# Patient Record
Sex: Female | Born: 1965 | Race: White | Hispanic: No | Marital: Married | Smoking: Former smoker
Health system: Southern US, Community
[De-identification: ages and names within clinical notes are randomized; demographics above are authoritative.]

## PROBLEM LIST (undated history)

## (undated) DIAGNOSIS — G43909 Migraine, unspecified, not intractable, without status migrainosus: Secondary | ICD-10-CM

## (undated) DIAGNOSIS — R002 Palpitations: Secondary | ICD-10-CM

## (undated) DIAGNOSIS — R58 Hemorrhage, not elsewhere classified: Secondary | ICD-10-CM

## (undated) DIAGNOSIS — I4719 Other supraventricular tachycardia: Secondary | ICD-10-CM

## (undated) DIAGNOSIS — I480 Paroxysmal atrial fibrillation: Secondary | ICD-10-CM

## (undated) DIAGNOSIS — R87619 Unspecified abnormal cytological findings in specimens from cervix uteri: Secondary | ICD-10-CM

## (undated) DIAGNOSIS — I471 Supraventricular tachycardia: Secondary | ICD-10-CM

## (undated) DIAGNOSIS — C4491 Basal cell carcinoma of skin, unspecified: Secondary | ICD-10-CM

## (undated) DIAGNOSIS — A6920 Lyme disease, unspecified: Secondary | ICD-10-CM

## (undated) HISTORY — DX: Basal cell carcinoma of skin, unspecified: C44.91

## (undated) HISTORY — DX: Lyme disease, unspecified: A69.20

## (undated) HISTORY — DX: Supraventricular tachycardia: I47.1

## (undated) HISTORY — DX: Unspecified abnormal cytological findings in specimens from cervix uteri: R87.619

## (undated) HISTORY — DX: Migraine, unspecified, not intractable, without status migrainosus: G43.909

## (undated) HISTORY — DX: Other supraventricular tachycardia: I47.19

---
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
## 1996-10-04 DIAGNOSIS — R87619 Unspecified abnormal cytological findings in specimens from cervix uteri: Secondary | ICD-10-CM

## 1996-10-04 HISTORY — DX: Unspecified abnormal cytological findings in specimens from cervix uteri: R87.619

## 2000-10-04 HISTORY — PX: COLONOSCOPY: SHX174

## 2005-01-11 ENCOUNTER — Inpatient Hospital Stay: Payer: Self-pay | Admitting: Unknown Physician Specialty

## 2005-01-11 DIAGNOSIS — O321XX0 Maternal care for breech presentation, not applicable or unspecified: Secondary | ICD-10-CM

## 2005-07-27 ENCOUNTER — Emergency Department: Payer: Self-pay | Admitting: Unknown Physician Specialty

## 2005-07-27 ENCOUNTER — Other Ambulatory Visit: Payer: Self-pay

## 2005-07-28 ENCOUNTER — Ambulatory Visit: Payer: Self-pay | Admitting: Unknown Physician Specialty

## 2005-08-13 ENCOUNTER — Ambulatory Visit: Payer: Self-pay | Admitting: Internal Medicine

## 2005-09-02 ENCOUNTER — Ambulatory Visit: Payer: Self-pay | Admitting: Unknown Physician Specialty

## 2005-09-07 ENCOUNTER — Ambulatory Visit: Payer: Self-pay | Admitting: Unknown Physician Specialty

## 2005-12-09 ENCOUNTER — Emergency Department: Payer: Self-pay | Admitting: Emergency Medicine

## 2006-07-01 ENCOUNTER — Ambulatory Visit: Payer: Self-pay

## 2006-10-04 HISTORY — PX: CARPAL TUNNEL RELEASE: SHX101

## 2007-01-17 ENCOUNTER — Ambulatory Visit: Payer: Self-pay

## 2007-09-19 ENCOUNTER — Ambulatory Visit: Payer: Self-pay

## 2007-09-21 ENCOUNTER — Ambulatory Visit: Payer: Self-pay

## 2007-09-22 ENCOUNTER — Ambulatory Visit: Payer: Self-pay

## 2008-08-06 ENCOUNTER — Ambulatory Visit: Payer: Self-pay | Admitting: Nurse Practitioner

## 2008-09-03 ENCOUNTER — Ambulatory Visit: Payer: Self-pay | Admitting: Nurse Practitioner

## 2008-10-04 HISTORY — PX: LASER ABLATION: SHX1947

## 2008-10-29 ENCOUNTER — Ambulatory Visit: Payer: Self-pay

## 2010-01-08 ENCOUNTER — Ambulatory Visit: Payer: Self-pay

## 2010-10-04 DIAGNOSIS — C4491 Basal cell carcinoma of skin, unspecified: Secondary | ICD-10-CM

## 2010-10-04 HISTORY — DX: Basal cell carcinoma of skin, unspecified: C44.91

## 2011-01-13 ENCOUNTER — Ambulatory Visit: Payer: Self-pay

## 2011-07-13 ENCOUNTER — Ambulatory Visit: Payer: Self-pay | Admitting: Specialist

## 2011-07-28 ENCOUNTER — Ambulatory Visit: Payer: Self-pay | Admitting: Specialist

## 2011-07-28 HISTORY — PX: HAND SURGERY: SHX662

## 2011-09-01 ENCOUNTER — Ambulatory Visit: Payer: Self-pay | Admitting: Specialist

## 2011-09-08 ENCOUNTER — Ambulatory Visit: Payer: Self-pay | Admitting: Specialist

## 2011-09-08 HISTORY — PX: CARPAL TUNNEL RELEASE: SHX101

## 2011-09-09 LAB — PATHOLOGY REPORT

## 2012-02-01 ENCOUNTER — Ambulatory Visit: Payer: Self-pay

## 2012-02-02 ENCOUNTER — Ambulatory Visit: Payer: Self-pay

## 2012-08-04 HISTORY — PX: BASAL CELL CARCINOMA EXCISION: SHX1214

## 2013-02-02 ENCOUNTER — Ambulatory Visit: Payer: Self-pay

## 2013-02-06 ENCOUNTER — Ambulatory Visit: Payer: Self-pay

## 2013-04-03 ENCOUNTER — Emergency Department: Payer: Self-pay | Admitting: Emergency Medicine

## 2014-02-15 ENCOUNTER — Ambulatory Visit: Payer: Self-pay

## 2015-02-20 ENCOUNTER — Other Ambulatory Visit: Payer: Self-pay | Admitting: Certified Nurse Midwife

## 2015-02-20 ENCOUNTER — Ambulatory Visit
Admission: RE | Admit: 2015-02-20 | Discharge: 2015-02-20 | Disposition: A | Discharge status: Home / Self Care | Payer: BLUE CROSS/BLUE SHIELD | Source: Ambulatory Visit | Attending: Certified Nurse Midwife | Admitting: Certified Nurse Midwife

## 2015-02-20 DIAGNOSIS — Z1231 Encounter for screening mammogram for malignant neoplasm of breast: Secondary | ICD-10-CM | POA: Diagnosis present

## 2016-02-11 ENCOUNTER — Other Ambulatory Visit: Payer: Self-pay | Admitting: Certified Nurse Midwife

## 2016-02-11 DIAGNOSIS — Z1231 Encounter for screening mammogram for malignant neoplasm of breast: Secondary | ICD-10-CM

## 2016-02-25 ENCOUNTER — Ambulatory Visit: Payer: BLUE CROSS/BLUE SHIELD

## 2016-03-04 ENCOUNTER — Encounter (INDEPENDENT_AMBULATORY_CARE_PROVIDER_SITE_OTHER): Payer: Self-pay

## 2016-03-04 ENCOUNTER — Other Ambulatory Visit: Payer: Self-pay | Admitting: Certified Nurse Midwife

## 2016-03-04 ENCOUNTER — Ambulatory Visit
Admission: RE | Admit: 2016-03-04 | Discharge: 2016-03-04 | Disposition: A | Discharge status: Home / Self Care | Payer: BLUE CROSS/BLUE SHIELD | Source: Ambulatory Visit | Attending: Certified Nurse Midwife | Admitting: Certified Nurse Midwife

## 2016-03-04 DIAGNOSIS — Z1231 Encounter for screening mammogram for malignant neoplasm of breast: Secondary | ICD-10-CM

## 2017-02-04 ENCOUNTER — Other Ambulatory Visit: Payer: Self-pay | Admitting: Certified Nurse Midwife

## 2017-02-04 ENCOUNTER — Other Ambulatory Visit: Payer: Self-pay | Admitting: Family Medicine

## 2017-02-04 DIAGNOSIS — Z1231 Encounter for screening mammogram for malignant neoplasm of breast: Secondary | ICD-10-CM

## 2017-03-09 ENCOUNTER — Ambulatory Visit
Admission: RE | Admit: 2017-03-09 | Discharge: 2017-03-09 | Disposition: A | Discharge status: Home / Self Care | Payer: BC Managed Care – PPO | Source: Ambulatory Visit | Attending: Family Medicine | Admitting: Family Medicine

## 2017-03-09 ENCOUNTER — Ambulatory Visit (INDEPENDENT_AMBULATORY_CARE_PROVIDER_SITE_OTHER): Payer: BC Managed Care – PPO | Admitting: Advanced Practice Midwife

## 2017-03-09 ENCOUNTER — Encounter: Payer: Self-pay | Admitting: Advanced Practice Midwife

## 2017-03-09 VITALS — BP 124/70 | Ht 66.0 in | Wt 142.0 lb

## 2017-03-09 DIAGNOSIS — N6311 Unspecified lump in the right breast, upper outer quadrant: Secondary | ICD-10-CM | POA: Diagnosis not present

## 2017-03-09 DIAGNOSIS — Z1231 Encounter for screening mammogram for malignant neoplasm of breast: Secondary | ICD-10-CM

## 2017-03-09 DIAGNOSIS — Z124 Encounter for screening for malignant neoplasm of cervix: Secondary | ICD-10-CM | POA: Diagnosis not present

## 2017-03-09 DIAGNOSIS — Z01419 Encounter for gynecological examination (general) (routine) without abnormal findings: Secondary | ICD-10-CM | POA: Diagnosis not present

## 2017-03-09 NOTE — Progress Notes (Signed)
Patient ID: Donna Reid, female   DOB: 1966/02/29, 51 y.o.   MRN: [id number]      Gynecology Annual Exam  PCP: System, Pcp Not In  Chief Complaint:  Chief Complaint  Patient presents with  . Annual Exam    History of Present Illness: Patient is a 51 y.o. [name] presents for annual exam. The patient has complaints today of lump in right breast. The patient was going to have a screening mammogram and then informed the breast center of a lump she noticed. She was then redirected to [facility] for annual clinic exam and then will have diagnostic imaging done. She first noticed the lump about a month ago. It is mobile and non-tender.    LMP: Patient's last menstrual period was 02/28/2017. Average Interval: regular, 28 days Duration of flow: 5 days Heavy Menses: no Clots: no Intermenstrual Bleeding: no Postcoital Bleeding: no Dysmenorrhea: no   The patient is sexually active. She currently uses vasectomy for contraception. She denies dyspareunia.  The patient does perform self breast exams.  There is no notable family history of breast or ovarian cancer in her family.  The patient wears seatbelts: yes.   The patient has regular exercise: yes.  The patient admits to occasional tobacco use.  The patient denies current symptoms of depression.    Review of Systems: Review of Systems  Constitutional: Negative.   HENT: Negative.   Eyes: Negative.   Respiratory: Negative.   Cardiovascular: Negative.   Gastrointestinal: Negative.   Genitourinary: Negative.   Musculoskeletal: Negative.   Skin: Negative.   Neurological: Negative.   Endo/Heme/Allergies: Negative.   Psychiatric/Behavioral: Negative.     Past Medical History:  History reviewed. No pertinent past medical history.  Past Surgical History:  Past Surgical History:  Procedure Laterality Date  . BASAL CELL CARCINOMA EXCISION    . CESAREAN SECTION      Gynecologic History:  Patient's last menstrual period was  02/28/2017. Contraception: vasectomy Last Pap: 1 year ago, results were: normal   Last mammogram: 1 year ago, results were: normal Obstetric History: [id number]  Family History:  History reviewed. No pertinent family history.  Social History:  Social History   Social History  . Marital status: Married    Spouse name: N/A  . Number of children: N/A  . Years of education: N/A   Occupational History  . Not on file.   Social History Main Topics  . Smoking status: Never Smoker  . Smokeless tobacco: Never Used  . Alcohol use No  . Drug use: No  . Sexual activity: Yes    Birth control/ protection: Surgical   Other Topics Concern  . Not on file   Social History Narrative  . No narrative on file    Allergies:  Allergies  Allergen Reactions  . Latex     Medications: Prior to Admission medications   Medication Sig Start Date End Date Taking? Authorizing Provider  valACYclovir (VALTREX) 1000 MG tablet  10/24/14  Yes [provider]    Physical Exam Vitals: Blood pressure 124/70, height 5\' 6"  (1.676 m), weight 142 lb (64.4 kg), last menstrual period 02/28/2017.  General: NAD HEENT: normocephalic, anicteric Thyroid: no enlargement, no palpable nodules Pulmonary: No increased work of breathing, CTAB Cardiovascular: RRR, distal pulses 2+ Breast: Breast symmetrical, no tenderness, no skin or nipple retraction present, no nipple discharge.  No axillary or supraclavicular lymphadenopathy. Right breast with mobile non-tender lump that is approximately 3cm x 4cm at 10:00 o'clock  Abdomen:  NABS, soft, non-tender, non-distended.  Umbilicus without lesions.  No hepatomegaly, splenomegaly or masses palpable. No evidence of hernia  Genitourinary:  External: Normal external female genitalia.  Normal urethral meatus, normal  Bartholin's and Skene's glands.    Vagina: Normal vaginal mucosa, no evidence of prolapse.    Cervix: Grossly normal in appearance, no bleeding, no  CMT  Uterus: Non-enlarged, mobile, normal contour.    Adnexa: ovaries non-enlarged, no adnexal masses  Rectal: deferred  Lymphatic: no evidence of inguinal lymphadenopathy Extremities: no edema, erythema, or tenderness Neurologic: Grossly intact Psychiatric: mood appropriate, affect full   Assessment: 51 y.o. [id number] Well woman exam with PAP smear  Plan: Problem List Items Addressed This Visit    None    Visit Diagnoses    Well woman exam with routine gynecological exam    -  Primary   Relevant Orders   Pap IG (Image Guided)   Cervical cancer screening       Relevant Orders   Pap IG (Image Guided)      1) Mammogram - recommend yearly screening mammogram.  Mammogram diagnostic mammogram and ultrasound orders sent. Message sent to [facility] for assistance with referral to breast clinic consult with Dr Bary Castilla per Dr Kenton Kingfisher recommendation   2) Continue healthy lifestyle diet and exercise  3) ASCCP guidelines and rational discussed.  Patient opts for yearly screening interval  4) Contraception - vasectomy  5) Routine healthcare maintenance including cholesterol, diabetes screening discussed managed by PCP   Rod Can, CNM

## 2017-03-11 ENCOUNTER — Other Ambulatory Visit: Payer: Self-pay | Admitting: Advanced Practice Midwife

## 2017-03-11 DIAGNOSIS — N6311 Unspecified lump in the right breast, upper outer quadrant: Secondary | ICD-10-CM

## 2017-03-11 LAB — PAP IG (IMAGE GUIDED): PAP Smear Comment: 0

## 2017-03-14 ENCOUNTER — Telehealth: Payer: Self-pay | Admitting: Advanced Practice Midwife

## 2017-03-14 ENCOUNTER — Other Ambulatory Visit: Payer: Self-pay | Admitting: Advanced Practice Midwife

## 2017-03-14 ENCOUNTER — Ambulatory Visit
Admission: RE | Admit: 2017-03-14 | Discharge: 2017-03-14 | Disposition: A | Discharge status: Home / Self Care | Payer: BC Managed Care – PPO | Source: Ambulatory Visit | Attending: Advanced Practice Midwife | Admitting: Advanced Practice Midwife

## 2017-03-14 ENCOUNTER — Encounter: Payer: Self-pay | Admitting: General Surgery

## 2017-03-14 DIAGNOSIS — N6311 Unspecified lump in the right breast, upper outer quadrant: Secondary | ICD-10-CM

## 2017-03-14 NOTE — Telephone Encounter (Signed)
Patient is aware but will be out of town 03/18/17 - 04/02/17. Per Brittney @ [facility], they do not have anything sooner than 03/18/17. Appt cancelled. Per [facility] @ [facility], patient is scheduled for today @ 2pm. Per Wells Guiles @ [facility], Dr Bary Castilla is booked this week so the patient is rescheduled for 04/05/17 @ 9am. Will see sooner if category 4 or 5.

## 2017-03-14 NOTE — Telephone Encounter (Signed)
Pt is calling about missed call. Pt would like a call [phone]

## 2017-03-14 NOTE — Telephone Encounter (Signed)
Patient is scheduled at [facility]'s first available appointment on Friday, 03/18/17 @ 9:20am. Patient is also scheduled to see Dr Bary Castilla at [facility] on Thursday, 03/24/17 @ 2:15pm. Threasa Beards will mail paperwork. Patient is to arrive @ 2pm if paperwork is completed, 1:45pm if not, and bring ins/id/specialty copay/meds in original bottles. [facility] is located in the 2-story building next door to our office, on the first floor. [facility]'s phone# is [phone]. Lmtrc.

## 2017-03-14 NOTE — Telephone Encounter (Signed)
Patient is aware of [facility] today, arrive at 2pm, 2 piece clothing, no lotion, powder, perfume, or deoderant, id and ins card. Patient was given address [address], 4th floor, [address]. Patient aware corner of [address], red brick building, parking in deck, across from [place], phone# [phone] or [phone]. Patient is aware of appt w/ Dr Bary Castilla on Tuesday, 04/05/17 @ 9am, arrive at 8:45am if paperwork is completed, 8:30am if not, bring ins/id/specialty copay/meds in original bottles. Dr Bary Castilla to see patient sooner if needed depending on results. Patient aware 2-story building next door to our office, first office on the right, phone# [phone].

## 2017-03-18 ENCOUNTER — Other Ambulatory Visit: Payer: BLUE CROSS/BLUE SHIELD

## 2017-03-24 ENCOUNTER — Ambulatory Visit: Payer: BC Managed Care – PPO | Admitting: General Surgery

## 2017-04-05 ENCOUNTER — Ambulatory Visit: Payer: BC Managed Care – PPO | Admitting: General Surgery

## 2017-04-21 ENCOUNTER — Encounter: Payer: Self-pay | Admitting: *Deleted

## 2017-10-04 DIAGNOSIS — I480 Paroxysmal atrial fibrillation: Secondary | ICD-10-CM

## 2017-10-04 HISTORY — DX: Paroxysmal atrial fibrillation: I48.0

## 2017-10-17 ENCOUNTER — Encounter (INDEPENDENT_AMBULATORY_CARE_PROVIDER_SITE_OTHER): Payer: BC Managed Care – PPO | Admitting: Vascular Surgery

## 2018-02-15 ENCOUNTER — Observation Stay
Admission: EM | Admit: 2018-02-15 | Discharge: 2018-02-16 | Disposition: A | Discharge status: Home / Self Care | Payer: BC Managed Care – PPO | Attending: Internal Medicine | Admitting: Internal Medicine

## 2018-02-15 ENCOUNTER — Emergency Department: Payer: BC Managed Care – PPO

## 2018-02-15 ENCOUNTER — Other Ambulatory Visit: Payer: Self-pay

## 2018-02-15 DIAGNOSIS — R42 Dizziness and giddiness: Secondary | ICD-10-CM | POA: Insufficient documentation

## 2018-02-15 DIAGNOSIS — R002 Palpitations: Secondary | ICD-10-CM | POA: Diagnosis present

## 2018-02-15 DIAGNOSIS — R748 Abnormal levels of other serum enzymes: Secondary | ICD-10-CM | POA: Diagnosis present

## 2018-02-15 DIAGNOSIS — R0789 Other chest pain: Secondary | ICD-10-CM | POA: Diagnosis not present

## 2018-02-15 DIAGNOSIS — R778 Other specified abnormalities of plasma proteins: Secondary | ICD-10-CM

## 2018-02-15 DIAGNOSIS — R7989 Other specified abnormal findings of blood chemistry: Secondary | ICD-10-CM | POA: Insufficient documentation

## 2018-02-15 DIAGNOSIS — Z85828 Personal history of other malignant neoplasm of skin: Secondary | ICD-10-CM | POA: Insufficient documentation

## 2018-02-15 DIAGNOSIS — Z87891 Personal history of nicotine dependence: Secondary | ICD-10-CM | POA: Insufficient documentation

## 2018-02-15 DIAGNOSIS — Z79899 Other long term (current) drug therapy: Secondary | ICD-10-CM | POA: Diagnosis not present

## 2018-02-15 DIAGNOSIS — R079 Chest pain, unspecified: Secondary | ICD-10-CM | POA: Diagnosis present

## 2018-02-15 HISTORY — DX: Palpitations: R00.2

## 2018-02-15 LAB — FIBRIN DERIVATIVES D-DIMER (ARMC ONLY): Fibrin derivatives D-dimer (ARMC): 132.77 ng/mL (FEU) (ref 0.00–499.00)

## 2018-02-15 LAB — CBC
HEMATOCRIT: 43.3 % (ref 35.0–47.0)
HEMOGLOBIN: 15 g/dL (ref 12.0–16.0)
MCH: 31.7 pg (ref 26.0–34.0)
MCHC: 34.6 g/dL (ref 32.0–36.0)
MCV: 91.7 fL (ref 80.0–100.0)
Platelets: 285 10*3/uL (ref 150–440)
RBC: 4.72 MIL/uL (ref 3.80–5.20)
RDW: 13 % (ref 11.5–14.5)
WBC: 5.4 10*3/uL (ref 3.6–11.0)

## 2018-02-15 LAB — TROPONIN I
TROPONIN I: 0.07 ng/mL — AB (ref <0.03)
Troponin I: <0.03 ng/mL (ref <0.03)

## 2018-02-15 LAB — BASIC METABOLIC PANEL
ANION GAP: 8 (ref 5–15)
BUN: 11 mg/dL (ref 6–20)
CHLORIDE: 105 mmol/L (ref 101–111)
CO2: 26 mmol/L (ref 22–32)
Calcium: 8.9 mg/dL (ref 8.9–10.3)
Creatinine, Ser: 0.78 mg/dL (ref 0.44–1.00)
GFR calc Af Amer: >60 mL/min (ref >60)
GLUCOSE: 108 mg/dL — AB (ref 65–99)
POTASSIUM: 3.6 mmol/L (ref 3.5–5.1)
Sodium: 139 mmol/L (ref 135–145)

## 2018-02-15 MED ORDER — ASPIRIN 81 MG PO CHEW
324.0000 mg | CHEWABLE_TABLET | Freq: Once | ORAL | Status: AC
  Administered 2018-02-15: 324 mg via ORAL
  Filled 2018-02-15: qty 4

## 2018-02-15 MED ORDER — NITROGLYCERIN 2 % TD OINT
0.5000 [in_us] | TOPICAL_OINTMENT | Freq: Once | TRANSDERMAL | Status: AC
  Administered 2018-02-15: 0.5 [in_us] via TOPICAL
  Filled 2018-02-15: qty 1

## 2018-02-15 MED ORDER — SODIUM CHLORIDE 0.9 % IV BOLUS
1000.0000 mL | Freq: Once | INTRAVENOUS | Status: AC
  Administered 2018-02-15: 1000 mL via INTRAVENOUS

## 2018-02-15 NOTE — H&P (Signed)
[facility] at [place] [facility] NAME: Donna Reid    MR#:  [id number]  DATE OF BIRTH:  Feb 07, 1966  DATE OF ADMISSION:  02/15/2018  PRIMARY CARE PHYSICIAN: System, Pcp Not In   REQUESTING/REFERRING PHYSICIAN: Quentin Cornwall, MD  CHIEF COMPLAINT:   Chief Complaint  Patient presents with  . Chest Pain    HISTORY OF PRESENT ILLNESS:  Donna Reid  is a 52 y.o. female who presents with an episode of palpitations, followed by some chest pain lasting a couple hours.  Patient states that palpitations started first and continued consistently for the duration of her symptoms.  After a couple of hours she started to develop some chest tightness.  She came to the ED and her first troponin was negative, but her second troponin was positive at 0.07.  Hospitalist were called for admission  PAST MEDICAL HISTORY:   Past Medical History:  Diagnosis Date  . Palpitations      PAST SURGICAL HISTORY:   Past Surgical History:  Procedure Laterality Date  . BASAL CELL CARCINOMA EXCISION    . CESAREAN SECTION       SOCIAL HISTORY:   Social History   Tobacco Use  . Smoking status: Former Research scientist (life sciences)  . Smokeless tobacco: Never Used  Substance Use Topics  . Alcohol use: No     FAMILY HISTORY:   Family History  Problem Relation Age of Onset  . Diabetes Maternal Grandmother   . Heart attack Maternal Grandfather   . Diabetes Paternal Grandmother      DRUG ALLERGIES:   Allergies  Allergen Reactions  . Latex     MEDICATIONS AT HOME:   Prior to Admission medications   Medication Sig Start Date End Date Taking? Authorizing Provider  fexofenadine-pseudoephedrine (ALLEGRA-D 24) 180-240 MG 24 hr tablet Take 1 tablet by mouth daily.   Yes [provider]  valACYclovir (VALTREX) 1000 MG tablet  10/24/14   [provider]    REVIEW OF SYSTEMS:  Review of Systems  Constitutional: Negative for chills, fever, malaise/fatigue and weight  loss.  HENT: Negative for ear pain, hearing loss and tinnitus.   Eyes: Negative for blurred vision, double vision, pain and redness.  Respiratory: Negative for cough, hemoptysis and shortness of breath.   Cardiovascular: Positive for chest pain and palpitations. Negative for orthopnea and leg swelling.  Gastrointestinal: Negative for abdominal pain, constipation, diarrhea, nausea and vomiting.  Genitourinary: Negative for dysuria, frequency and hematuria.  Musculoskeletal: Negative for back pain, joint pain and neck pain.  Skin:       No acne, rash, or lesions  Neurological: Negative for dizziness, tremors, focal weakness and weakness.  Endo/Heme/Allergies: Negative for polydipsia. Does not bruise/bleed easily.  Psychiatric/Behavioral: Negative for depression. The patient is not nervous/anxious and does not have insomnia.      VITAL SIGNS:   Vitals:   02/15/18 2157 02/15/18 2200 02/15/18 2230 02/15/18 2330  BP:  132/78 121/79 122/83  Pulse:  69 66 64  Resp: 16     Temp:      TempSrc:      SpO2:  100% 100% 100%  Weight:      Height:       Wt Readings from Last 3 Encounters:  02/15/18 61.2 kg (135 lb)  03/09/17 64.4 kg (142 lb)    PHYSICAL EXAMINATION:  Physical Exam  Vitals reviewed. Constitutional: She is oriented to person, place, and time. She appears well-developed and well-nourished. No distress.  HENT:  Head: Normocephalic and atraumatic.  Mouth/Throat: Oropharynx is clear and moist.  Eyes: Pupils are equal, round, and reactive to light. Conjunctivae and EOM are normal. No scleral icterus.  Neck: Normal range of motion. Neck supple. No JVD present. No thyromegaly present.  Cardiovascular: Normal rate, regular rhythm and intact distal pulses. Exam reveals no gallop and no friction rub.  No murmur heard. Respiratory: Effort normal and breath sounds normal. No respiratory distress. She has no wheezes. She has no rales.  GI: Soft. Bowel sounds are normal. She exhibits no  distension. There is no tenderness.  Musculoskeletal: Normal range of motion. She exhibits no edema.  No arthritis, no gout  Lymphadenopathy:    She has no cervical adenopathy.  Neurological: She is alert and oriented to person, place, and time. No cranial nerve deficit.  No dysarthria, no aphasia  Skin: Skin is warm and dry. No rash noted. No erythema.  Psychiatric: She has a normal mood and affect. Her behavior is normal. Judgment and thought content normal.    LABORATORY PANEL:   CBC Recent Labs  Lab 02/15/18 1837  WBC 5.4  HGB 15.0  HCT 43.3  PLT 285   ------------------------------------------------------------------------------------------------------------------  Chemistries  Recent Labs  Lab 02/15/18 1837  NA 139  K 3.6  CL 105  CO2 26  GLUCOSE 108*  BUN 11  CREATININE 0.78  CALCIUM 8.9   ------------------------------------------------------------------------------------------------------------------  Cardiac Enzymes Recent Labs  Lab 02/15/18 2156  TROPONINI 0.07*   ------------------------------------------------------------------------------------------------------------------  RADIOLOGY:  Dg Chest 2 View  Result Date: 02/15/2018 CLINICAL DATA:  Chest pain EXAM: CHEST - 2 VIEW COMPARISON:  07/27/2005 FINDINGS: Hyperinflation. No focal opacity or pleural effusion. Pectus deformity of the lower anterior chest wall contributing to increased opacity at the right cardiac border. No pneumothorax. Mild scoliosis of the spine IMPRESSION: No active cardiopulmonary disease. Electronically Signed   By: Donavan Foil M.D.   On: 02/15/2018 19:34    EKG:   Orders placed or performed during the hospital encounter of 02/15/18  . EKG 12-Lead  . EKG 12-Lead  . ED EKG within 10 minutes  . ED EKG within 10 minutes    IMPRESSION AND PLAN:  Principal Problem:   Chest pain -second troponin positive, we will admit her and trend her troponins, get an echocardiogram  and a cardiology consult.  Chart review performed and case discussed with ED provider. Labs, imaging and/or ECG reviewed by provider and discussed with patient/family. Management plans discussed with the patient and/or family.  DVT PROPHYLAXIS: SubQ lovenox  GI PROPHYLAXIS: None  ADMISSION STATUS: Observation  CODE STATUS: Full  TOTAL TIME TAKING CARE OF THIS PATIENT: 40 minutes.   Dutch Ing [place] 02/15/2018, 11:45 PM  [employer]  [phone]  CC: Primary care physician; System, Pcp Not In  Note:  This document was prepared using Dragon voice recognition software and may include unintentional dictation errors.

## 2018-02-15 NOTE — ED Triage Notes (Signed)
Pt to ER via POV c/o chest pain to left side that started at 1600t doay while driving. C/o SOB that began at same times. Denies NV, diaphoresis. Pt alert and oriented X4, active, cooperative, pt in NAD. RR even and unlabored, color WNL.

## 2018-02-15 NOTE — ED Provider Notes (Signed)
[facility] Emergency Department Provider Note    First MD Initiated Contact with Patient 02/15/18 2135     (approximate)  I have reviewed the triage vital signs and the nursing notes.   HISTORY  Chief Complaint Chest Pain    HPI Donna Reid is a 52 y.o. female presents to the ER with chief complaint of left-sided chest pain and pressure that started around 4:00 today while she was driving.  Says that she started feeling flushed and having palpitations.  Her daughter was there with her who is a EMT student states that she measured her heart rate manual up to 160.  Patient started feeling groggy.  Did not lose consciousness.  No pain ripping or tearing through to her back.  Denies any nausea or vomiting or diaphoresis.  Has never had pain like this before but has had issues with palpitations in the past and did have a Holter monitor which was reportedly negative.  She does not smoke.  No history of high blood pressure, high cholesterol or diabetes.  No recent fevers.  She is currently pain-free.  History reviewed. No pertinent past medical history. No family history on file. Past Surgical History:  Procedure Laterality Date  . BASAL CELL CARCINOMA EXCISION    . CESAREAN SECTION     There are no active problems to display for this patient.     Prior to Admission medications   Medication Sig Start Date End Date Taking? Authorizing Provider  fexofenadine-pseudoephedrine (ALLEGRA-D 24) 180-240 MG 24 hr tablet Take 1 tablet by mouth daily.   Yes [provider]  valACYclovir (VALTREX) 1000 MG tablet  10/24/14   [provider]    Allergies Latex    Social History Social History   Tobacco Use  . Smoking status: Never Smoker  . Smokeless tobacco: Never Used  Substance Use Topics  . Alcohol use: No  . Drug use: No    Review of Systems Patient denies headaches, rhinorrhea, blurry vision, numbness, shortness of breath, chest  pain, edema, cough, abdominal pain, nausea, vomiting, diarrhea, dysuria, fevers, rashes or hallucinations unless otherwise stated above in HPI. ____________________________________________   PHYSICAL EXAM:  VITAL SIGNS: Vitals:   02/15/18 2200 02/15/18 2230  BP: 132/78 121/79  Pulse: 69 66  Resp:    Temp:    SpO2: 100% 100%    Constitutional: Alert and oriented. Well appearing and in no acute distress. Eyes: Conjunctivae are normal.  Head: Atraumatic. Nose: No congestion/rhinnorhea. Mouth/Throat: Mucous membranes are moist.   Neck: No stridor. Painless ROM.  Cardiovascular: Normal rate, regular rhythm. Grossly normal heart sounds.  Good peripheral circulation. Respiratory: Normal respiratory effort.  No retractions. Lungs CTAB. Gastrointestinal: Soft and nontender. No distention. No abdominal bruits. No CVA tenderness. Genitourinary:  Musculoskeletal: No lower extremity tenderness nor edema.  No joint effusions. Neurologic:  Normal speech and language. No gross focal neurologic deficits are appreciated. No facial droop Skin:  Skin is warm, dry and intact. No rash noted. Psychiatric: Mood and affect are normal. Speech and behavior are normal.  ____________________________________________   LABS (all labs ordered are listed, but only abnormal results are displayed)  Results for orders placed or performed during the hospital encounter of 02/15/18 (from the past 24 hour(s))  Basic metabolic panel     Status: Abnormal   Collection Time: 02/15/18  6:37 PM  Result Value Ref Range   Sodium 139 135 - 145 mmol/L   Potassium 3.6 3.5 - 5.1 mmol/L  Chloride 105 101 - 111 mmol/L   CO2 26 22 - 32 mmol/L   Glucose, Bld 108 (H) 65 - 99 mg/dL   BUN 11 6 - 20 mg/dL   Creatinine, Ser 0.78 0.44 - 1.00 mg/dL   Calcium 8.9 8.9 - 10.3 mg/dL   GFR calc non Af Amer >60 >60 mL/min   GFR calc Af Amer >60 >60 mL/min   Anion gap 8 5 - 15  CBC     Status: None   Collection Time: 02/15/18  6:37  PM  Result Value Ref Range   WBC 5.4 3.6 - 11.0 K/uL   RBC 4.72 3.80 - 5.20 MIL/uL   Hemoglobin 15.0 12.0 - 16.0 g/dL   HCT 43.3 35.0 - 47.0 %   MCV 91.7 80.0 - 100.0 fL   MCH 31.7 26.0 - 34.0 pg   MCHC 34.6 32.0 - 36.0 g/dL   RDW 13.0 11.5 - 14.5 %   Platelets 285 150 - 440 K/uL  Troponin I     Status: None   Collection Time: 02/15/18  6:37 PM  Result Value Ref Range   Troponin I <0.03 <0.03 ng/mL  Fibrin derivatives D-Dimer ([facility] only)     Status: None   Collection Time: 02/15/18  9:48 PM  Result Value Ref Range   Fibrin derivatives D-dimer ([facility]) 132.77 0.00 - 499.00 ng/mL (FEU)  Troponin I     Status: Abnormal   Collection Time: 02/15/18  9:56 PM  Result Value Ref Range   Troponin I 0.07 (HH) <0.03 ng/mL   ____________________________________________  EKG My review and personal interpretation at Time: 18:27   Indication: chest pain  Rate: 120  Rhythm: sinus Axis: normal Other: normal intervals, poor r wave progression? ____________________________________________  RADIOLOGY  I personally reviewed all radiographic images ordered to evaluate for the above acute complaints and reviewed radiology reports and findings.  These findings were personally discussed with the patient.  Please see medical record for radiology report.  ____________________________________________   PROCEDURES  Procedure(s) performed:  Procedures    Critical Care performed: no ____________________________________________   INITIAL IMPRESSION / ASSESSMENT AND PLAN / ED COURSE  Pertinent labs & imaging results that were available during my care of the patient were reviewed by me and considered in my medical decision making (see chart for details).  DDX: ACS, pericarditis, esophagitis, boerhaaves, pe, dissection, pna, bronchitis, costochondritis   Donna Reid is a 52 y.o. who presents to the ED with chest pain as described above.  EKG shows no evidence of acute ischemia certainly no  STEMI but does have some nonspecific changes with delayed R wave progression.  May even be reflective of a slow to do one a flutter as there is striking regularity and perhaps the patient was having episodes of RVR causing pain.  Initial troponin is negative.  Her abdominal exam is soft and benign.  She is low risk by Wells criteria but given her tachycardia a d-dimer was sent to further risk stratify which is negative.  Given the patient's mentation a repeat troponin was sent to further risk stratify which did come back elevated.  Presentation concerning for ACS but she is currently pain-free therefore do not feel heparin indicated.  Patient given aspirin and Nitropaste.  Patient will require hospitalization for further medical management.      As part of my medical decision making, I reviewed the following data within the [facility] notes reviewed and incorporated, Labs reviewed, notes from  prior ED visits.   ____________________________________________   FINAL CLINICAL IMPRESSION(S) / ED DIAGNOSES  Final diagnoses:  Chest pain, unspecified type  Palpitations  Elevated troponin I level      NEW MEDICATIONS STARTED DURING THIS VISIT:  New Prescriptions   No medications on file     Note:  This document was prepared using Dragon voice recognition software and may include unintentional dictation errors.    Merlyn Lot, MD 02/15/18 [phone]

## 2018-02-15 NOTE — ED Notes (Signed)
Date and time results received: 02/15/18 2250  Test: Troponin Critical Value: 0.07  Name of Provider Notified: Dr. Quentin Cornwall

## 2018-02-16 ENCOUNTER — Observation Stay
Admit: 2018-02-16 | Discharge: 2018-02-16 | Disposition: A | Discharge status: Home / Self Care | Payer: BC Managed Care – PPO | Attending: Internal Medicine | Admitting: Internal Medicine

## 2018-02-16 LAB — ECHOCARDIOGRAM COMPLETE
HEIGHTINCHES: 66 in
WEIGHTICAEL: 2190.4 [oz_av]

## 2018-02-16 LAB — BASIC METABOLIC PANEL
ANION GAP: 7 (ref 5–15)
BUN: 11 mg/dL (ref 6–20)
CALCIUM: 8.3 mg/dL — AB (ref 8.9–10.3)
CO2: 22 mmol/L (ref 22–32)
CREATININE: 0.81 mg/dL (ref 0.44–1.00)
Chloride: 109 mmol/L (ref 101–111)
GFR calc Af Amer: >60 mL/min (ref >60)
GLUCOSE: 99 mg/dL (ref 65–99)
Potassium: 3.7 mmol/L (ref 3.5–5.1)
Sodium: 138 mmol/L (ref 135–145)

## 2018-02-16 LAB — CBC
HCT: 37.1 % (ref 35.0–47.0)
HEMOGLOBIN: 13 g/dL (ref 12.0–16.0)
MCH: 32 pg (ref 26.0–34.0)
MCHC: 35 g/dL (ref 32.0–36.0)
MCV: 91.3 fL (ref 80.0–100.0)
Platelets: 230 10*3/uL (ref 150–440)
RBC: 4.06 MIL/uL (ref 3.80–5.20)
RDW: 12.8 % (ref 11.5–14.5)
WBC: 4.3 10*3/uL (ref 3.6–11.0)

## 2018-02-16 LAB — TROPONIN I
TROPONIN I: 0.07 ng/mL — AB (ref <0.03)
TROPONIN I: 0.06 ng/mL — AB (ref <0.03)

## 2018-02-16 MED ORDER — ASPIRIN EC 81 MG PO TBEC: 81.0000 mg | DELAYED_RELEASE_TABLET | Freq: Every day | ORAL | 2 refills | Status: AC

## 2018-02-16 MED ORDER — NITROGLYCERIN 0.4 MG SL SUBL: 0.4000 mg | SUBLINGUAL_TABLET | SUBLINGUAL | Status: DC | PRN

## 2018-02-16 MED ORDER — ACETAMINOPHEN 650 MG RE SUPP: 650.0000 mg | Freq: Four times a day (QID) | RECTAL | Status: DC | PRN

## 2018-02-16 MED ORDER — ACETAMINOPHEN 325 MG PO TABS
650.0000 mg | ORAL_TABLET | Freq: Four times a day (QID) | ORAL | Status: DC | PRN
  Administered 2018-02-16 (×2): 650 mg via ORAL
  Filled 2018-02-16 (×2): qty 2

## 2018-02-16 MED ORDER — ONDANSETRON HCL 4 MG PO TABS: 4.0000 mg | ORAL_TABLET | Freq: Four times a day (QID) | ORAL | Status: DC | PRN

## 2018-02-16 MED ORDER — ONDANSETRON HCL 4 MG/2ML IJ SOLN: 4.0000 mg | Freq: Four times a day (QID) | INTRAMUSCULAR | Status: DC | PRN

## 2018-02-16 MED ORDER — OXYCODONE HCL 5 MG PO TABS
5.0000 mg | ORAL_TABLET | ORAL | Status: DC | PRN
  Administered 2018-02-16: 5 mg via ORAL
  Filled 2018-02-16: qty 1

## 2018-02-16 MED ORDER — ENOXAPARIN SODIUM 40 MG/0.4ML ~~LOC~~ SOLN: 40.0000 mg | SUBCUTANEOUS | Status: DC

## 2018-02-16 NOTE — Plan of Care (Signed)
  Problem: Activity: Goal: Risk for activity intolerance will decrease Outcome: Progressing   Problem: Pain Managment: Goal: General experience of comfort will improve Outcome: Progressing   

## 2018-02-16 NOTE — Discharge Summary (Signed)
[facility] [place] NAME: Donna Reid    MR#:  [id number]  DATE OF BIRTH:  [birth year]  DATE OF ADMISSION:  02/15/2018 ADMITTING PHYSICIAN: Lance Coon, MD  DATE OF DISCHARGE: 02/16/2018  PRIMARY CARE PHYSICIAN: System, Pcp Not In    ADMISSION DIAGNOSIS:  Palpitations [R00.2] Elevated troponin I level [R74.8] Chest pain, unspecified type [R07.9]  DISCHARGE DIAGNOSIS:  Palpitations--resolved a present Chest pain--w/u as out pt  SECONDARY DIAGNOSIS:   Past Medical History:  Diagnosis Date  . Palpitations     HOSPITAL COURSE:  Donna Reid  is a 52 y.o. female who presents with an episode of palpitations, followed by some chest pain lasting a couple hours.  Patient states that palpitations started first and continued consistently for the duration of her symptoms.  After a couple of hours she started to develop some chest tightness.   *Chest pain with episode of palpitations -echo per Dr Clayborn Bigness No WMA -Troponin 0.07 x2 -no cp -HR stable -cont asa, avoid caffeine -Out pt stress test schedule don Monday with dr Nehemiah Massed at 9:30 am  BP stable D/c home with out pt cardiology w/u. Pt advised to return to ER if s/s recur. D.w husband    CONSULTS OBTAINED:  Treatment Team:  Yolonda Kida, MD  DRUG ALLERGIES:   Allergies  Allergen Reactions  . Latex     DISCHARGE MEDICATIONS:   Allergies as of 02/16/2018      Reactions   Latex       Medication List    TAKE these medications   aspirin EC 81 MG tablet Take 1 tablet (81 mg total) by mouth daily.   fexofenadine-pseudoephedrine 180-240 MG 24 hr tablet Commonly known as:  ALLEGRA-D 24 Take 1 tablet by mouth daily.   valACYclovir 1000 MG tablet Commonly known as:  VALTREX       If you experience worsening of your admission symptoms, develop shortness of breath, life threatening emergency, suicidal or homicidal thoughts you must seek medical attention  immediately by calling 911 or calling your MD immediately  if symptoms less severe.  You Must read complete instructions/literature along with all the possible adverse reactions/side effects for all the Medicines you take and that have been prescribed to you. Take any new Medicines after you have completely understood and accept all the possible adverse reactions/side effects.   Please note  You were cared for by a hospitalist during your hospital stay. If you have any questions about your discharge medications or the care you received while you were in the hospital after you are discharged, you can call the unit and asked to speak with the hospitalist on call if the hospitalist that took care of you is not available. Once you are discharged, your primary care physician will handle any further medical issues. Please note that NO REFILLS for any discharge medications will be authorized once you are discharged, as it is imperative that you return to your primary care physician (or establish a relationship with a primary care physician if you do not have one) for your aftercare needs so that they can reassess your need for medications and monitor your lab values. Today   SUBJECTIVE   No new symptoms  VITAL SIGNS:  Blood pressure 128/87, pulse 79, temperature 98.1 F (36.7 C), resp. rate 14, height 5\' 6"  (1.676 m), weight 62.1 kg (136 lb 14.4 oz), last menstrual period 02/08/2018, SpO2 100 %.  I/O:  Intake/Output Summary (Last 24 hours) at 02/16/2018 1410 Last data filed at 02/16/2018 1348 Gross per 24 hour  Intake 1240 ml  Output 500 ml  Net 740 ml    PHYSICAL EXAMINATION:  GENERAL:  52 y.o.-year-old patient lying in the bed with no acute distress.  EYES: Pupils equal, round, reactive to light and accommodation. No scleral icterus. Extraocular muscles intact.  HEENT: Head atraumatic, normocephalic. Oropharynx and nasopharynx clear.  NECK:  Supple, no jugular venous distention. No thyroid  enlargement, no tenderness.  LUNGS: Normal breath sounds bilaterally, no wheezing, rales,rhonchi or crepitation. No use of accessory muscles of respiration.  CARDIOVASCULAR: S1, S2 normal. No murmurs, rubs, or gallops.  ABDOMEN: Soft, non-tender, non-distended. Bowel sounds present. No organomegaly or mass.  EXTREMITIES: No pedal edema, cyanosis, or clubbing.  NEUROLOGIC: Cranial nerves II through XII are intact. Muscle strength 5/5 in all extremities. Sensation intact. Gait not checked.  PSYCHIATRIC: The patient is alert and oriented x 3.  SKIN: No obvious rash, lesion, or ulcer.   DATA REVIEW:   CBC  Recent Labs  Lab 02/16/18 0355  WBC 4.3  HGB 13.0  HCT 37.1  PLT 230    Chemistries  Recent Labs  Lab 02/16/18 0355  NA 138  K 3.7  CL 109  CO2 22  GLUCOSE 99  BUN 11  CREATININE 0.81  CALCIUM 8.3*    Microbiology Results   No results found for this or any previous visit (from the past 240 hour(s)).  RADIOLOGY:  Dg Chest 2 View  Result Date: 02/15/2018 CLINICAL DATA:  Chest pain EXAM: CHEST - 2 VIEW COMPARISON:  07/27/2005 FINDINGS: Hyperinflation. No focal opacity or pleural effusion. Pectus deformity of the lower anterior chest wall contributing to increased opacity at the right cardiac border. No pneumothorax. Mild scoliosis of the spine IMPRESSION: No active cardiopulmonary disease. Electronically Signed   By: Donavan Foil M.D.   On: 02/15/2018 19:34     Management plans discussed with the patient, family and they are in agreement.  CODE STATUS:     Code Status Orders  (From admission, onward)        Start     Ordered   02/16/18 0041  Full code  Continuous     02/16/18 0040    Code Status History    This patient has a current code status but no historical code status.      TOTAL TIME TAKING CARE OF THIS PATIENT: 40 minutes.    Fritzi Mandes M.D on 02/16/2018 at 2:10 PM  Between 7am to 6pm - Pager - [phone] After 6pm go to [email or web address] -  password [email or web address] [facility]  Office  [phone]  CC: Primary care physician; System, Pcp Not In

## 2018-02-16 NOTE — Discharge Instructions (Signed)
Avoid Caffeine

## 2018-02-16 NOTE — Progress Notes (Signed)
*  PRELIMINARY RESULTS* Echocardiogram 2D Echocardiogram has been performed.  Donna Reid 02/16/2018, 10:16 AM

## 2018-02-16 NOTE — Progress Notes (Signed)
Pt discharged to home, pt scheduled for outpt stress test Monday @ 9:30, will see Dr Clayborn Bigness in office tomorrow morning. Pt is pain free at this time, VSS.

## 2018-02-17 LAB — HIV ANTIBODY (ROUTINE TESTING W REFLEX): HIV Screen 4th Generation wRfx: NONREACTIVE

## 2018-02-17 NOTE — Consult Note (Signed)
Reason for Consult: Chest pain palpitations Referring Physician: Dr. Lance Coon hospitalist Dr. Nehemiah Massed cardiology  Donna Reid is an 52 y.o. female.  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  Patient had palpitations on and off pain relatively recurrent and persistent.  Patient after couple of hours started to develop some left-sided chest discomfort decided come to emergency room was found to have borderline troponins so was advised for further assessment evaluation has felt better since she is been in the hospital no worsening chest pain no shortness of breath  Past Medical History:  Diagnosis Date  . Palpitations     Past Surgical History:  Procedure Laterality Date  . BASAL CELL CARCINOMA EXCISION    . CESAREAN SECTION      Family History  Problem Relation Age of Onset  . Diabetes Maternal Grandmother   . Heart attack Maternal Grandfather   . Diabetes Paternal Grandmother     Social History:  reports that she has quit smoking. She has never used smokeless tobacco. She reports that she does not drink alcohol or use drugs.  Allergies:  Allergies  Allergen Reactions  . Latex     Medications: I have reviewed the patient's current medications.  Results for orders placed or performed during the hospital encounter of 02/15/18 (from the past 48 hour(s))  Basic metabolic panel     Status: Abnormal   Collection Time: 02/15/18  6:37 PM  Result Value Ref Range   Sodium 139 135 - 145 mmol/L   Potassium 3.6 3.5 - 5.1 mmol/L   Chloride 105 101 - 111 mmol/L   CO2 26 22 - 32 mmol/L   Glucose, Bld 108 (H) 65 - 99 mg/dL   BUN 11 6 - 20 mg/dL   Creatinine, Ser 0.78 0.44 - 1.00 mg/dL   Calcium 8.9 8.9 - 10.3 mg/dL   GFR calc non Af Amer >60 >60 mL/min   GFR calc Af Amer >60 >60 mL/min    Comment: (NOTE) The eGFR has been calculated using the CKD EPI equation. This  calculation has not been validated in all clinical situations. eGFR's persistently <60 mL/min signify possible Chronic Kidney Disease.    Anion gap 8 5 - 15    Comment: Performed at [facility], [address] [place], [place] [address]  CBC     Status: None   Collection Time: 02/15/18  6:37 PM  Result Value Ref Range   WBC 5.4 3.6 - 11.0 K/uL   RBC 4.72 3.80 - 5.20 MIL/uL   Hemoglobin 15.0 12.0 - 16.0 g/dL   HCT 43.3 35.0 - 47.0 %   MCV 91.7 80.0 - 100.0 fL   MCH 31.7 26.0 - 34.0 pg   MCHC 34.6 32.0 - 36.0 g/dL   RDW 13.0 11.5 - 14.5 %   Platelets 285 150 - 440 K/uL    Comment: Performed at [facility], [address] [place], [place] [address]  Troponin I     Status: None   Collection Time: 02/15/18  6:37 PM  Result Value Ref Range   Troponin I <0.03 <0.03 ng/mL    Comment: Performed at [facility], [address] [place], [place] [address]  Fibrin derivatives D-[facility] only)     Status: None   Collection Time: 02/15/18  9:48 PM  Result Value Ref Range   Fibrin derivatives D-dimer ([facility]) 132.77 0.00 -  499.00 ng/mL (FEU)    Comment: (NOTE) <> Exclusion of Venous Thromboembolism (VTE) - OUTPATIENT ONLY   (Emergency Department or [place])   0-499 ng/ml (FEU): With a low to intermediate pretest probability                      for VTE this test result excludes the diagnosis                      of VTE.   >499 ng/ml (FEU) : VTE not excluded; additional work up for VTE is                      required. <> Testing on Inpatients and Evaluation of Disseminated Intravascular   Coagulation (DIC) Reference Range:   0-499 ng/ml (FEU) Performed at [facility], [address] [place], [place] [address]   Troponin I     Status: Abnormal   Collection Time: 02/15/18  9:56 PM  Result Value Ref Range   Troponin I 0.07 (HH) <0.03 ng/mL    Comment: CRITICAL RESULT CALLED TO, READ BACK BY AND VERIFIED WITH REBECCA UHORCHUK ON 02/15/18 AT  2232 JAG Performed at [facility], [address] [place], [place] [address]   Troponin I     Status: Abnormal   Collection Time: 02/16/18  3:55 AM  Result Value Ref Range   Troponin I 0.07 (HH) <0.03 ng/mL    Comment: CRITICAL VALUE NOTED. VALUE IS CONSISTENT WITH PREVIOUSLY REPORTED/CALLED VALUE. JAG Performed at [facility], [address] [place], [place] [address]   Basic metabolic panel     Status: Abnormal   Collection Time: 02/16/18  3:55 AM  Result Value Ref Range   Sodium 138 135 - 145 mmol/L   Potassium 3.7 3.5 - 5.1 mmol/L   Chloride 109 101 - 111 mmol/L   CO2 22 22 - 32 mmol/L   Glucose, Bld 99 65 - 99 mg/dL   BUN 11 6 - 20 mg/dL   Creatinine, Ser 0.81 0.44 - 1.00 mg/dL   Calcium 8.3 (L) 8.9 - 10.3 mg/dL   GFR calc non Af Amer >60 >60 mL/min   GFR calc Af Amer >60 >60 mL/min    Comment: (NOTE) The eGFR has been calculated using the CKD EPI equation. This calculation has not been validated in all clinical situations. eGFR's persistently <60 mL/min signify possible Chronic Kidney Disease.    Anion gap 7 5 - 15    Comment: Performed at [facility], [address] [place], [place] [address]  CBC     Status: None   Collection Time: 02/16/18  3:55 AM  Result Value Ref Range   WBC 4.3 3.6 - 11.0 K/uL   RBC 4.06 3.80 - 5.20 MIL/uL   Hemoglobin 13.0 12.0 - 16.0 g/dL   HCT 37.1 35.0 - 47.0 %   MCV 91.3 80.0 - 100.0 fL   MCH 32.0 26.0 - 34.0 pg   MCHC 35.0 32.0 - 36.0 g/dL   RDW 12.8 11.5 - 14.5 %   Platelets 230 150 - 440 K/uL    Comment: Performed at [facility], [address] [place], [place] [address]  Troponin I     Status: Abnormal   Collection Time: 02/16/18 10:12 AM  Result Value Ref Range   Troponin I 0.06 (HH) <0.03 ng/mL    Comment: CRITICAL VALUE NOTED. VALUE IS CONSISTENT WITH PREVIOUSLY REPORTED/CALLED VALUE...[place]  Performed at [facility], [address] [place], [place] [address]     Dg  Chest 2 View  Result Date: 02/15/2018 CLINICAL DATA:  Chest pain EXAM: CHEST - 2 VIEW COMPARISON:  07/27/2005 FINDINGS: Hyperinflation. No focal opacity or pleural effusion. Pectus deformity of the lower anterior chest wall contributing to increased opacity at the right cardiac border. No pneumothorax. Mild scoliosis of the spine IMPRESSION: No active cardiopulmonary disease. Electronically Signed   By: Donavan Foil M.D.   On: 02/15/2018 19:34    Review of Systems  Constitutional: Positive for malaise/fatigue.  HENT: Negative.   Eyes: Negative.   Respiratory: Positive for shortness of breath.   Cardiovascular: Positive for chest pain and palpitations.  Gastrointestinal: Negative.   Genitourinary: Negative.   Musculoskeletal: Negative.   Skin: Negative.   Neurological: Negative.   Endo/Heme/Allergies: Negative.   Psychiatric/Behavioral: Negative.    Blood pressure 128/87, pulse 79, temperature 98.1 F (36.7 C), resp. rate 14, height _0  (1.676 m), weight 136 lb 14.4 oz (62.1 kg), last menstrual period 02/08/2018, SpO2 100 %. Physical Exam  Nursing note and vitals reviewed. Constitutional: She is oriented to person, place, and time. She appears well-developed and well-nourished.  HENT:  Head: Normocephalic and atraumatic.  Eyes: Pupils are equal, round, and reactive to light. Conjunctivae and EOM are normal.  Neck: Neck supple.  Cardiovascular: Normal rate, regular rhythm and normal heart sounds.  Respiratory: Effort normal and breath sounds normal.  GI: Soft. Bowel sounds are normal.  Musculoskeletal: Normal range of motion.  Neurological: She is alert and oriented to person, place, and time. She has normal reflexes.  Skin: Skin is warm and dry.  Psychiatric: She has a normal mood and affect.    Assessment/Plan: Palpitation Tachycardia Chest pain Borderline troponins Vertigo History of smoking but quit . Plan Agree with admission rule out microinfarction Follow up of  troponins EKGs Follow-up troponins possible demand ischemia Echocardiogram for assessment of left ventricular function Recommend functional study complex to be done as an outpatient Continue to advised to refrain from tobacco abuse Patient follow-up with cardiology 1 to 2 weeks  Dwayne D Callwood 02/17/2018, 8:07 AM

## 2018-04-02 NOTE — Progress Notes (Addendum)
Patient ID: Donna Reid, female   DOB: 1966/09/17, 52 y.o.   MRN: [id number]      Gynecology Annual Exam  PCP: System, Pcp Not In  Chief Complaint:  Chief Complaint  Patient presents with  . Gynecologic Exam    History of Present Illness: Patient is a 52 y.o. WF G2P2002 who presents for her annual exam. The patient has complaints today of no menses since 02/08/2018. She also began having night sweats and tachycardia around mid May. She was diagnosed with  PST and begun on metoprolol. Her dose was just increased to 50 mgm 4-5 days ago.  She also complains of some mild vulvar itching and irritation recently Prior to May, her menses were regular, occurring every month, lasting 7-10 days with a moderate flow, and withut clots. Denies intermenstrual bleeding and dysmenorrhea. Last Pap smear was 03/09/2017 and was NIL. Remote history of CIN1 in 1998  Her past medical history is remarkable for allergic rhinitis, migraine headaches,and basal cell carcinoma The patient is sexually active. She currently uses vasectomy for contraception. She denies dyspareunia.  The patient does perform self breast exams. She continues to palpate the right breast mass that was diagnosed as a cluster of cysts at time of her diagnostic mammogram 03/14/2017.  There is no notable family history of breast or ovarian cancer in her family.  The patient wears seatbelts: yes.   The patient has regular exercise: yes, walks 5x/week.  The patient admits to occasional alcohol use. Denies tobacco use.   The patient denies current symptoms of depression, but has been anxoius recently regarding her tachycardia, palpitations.   She had a lipid panel in 2017 that was normal.  Review of Systems: Review of Systems  Constitutional: Negative.   HENT: Negative.   Eyes: Negative.   Respiratory: Negative.   Cardiovascular: Positive for chest pain and palpitations.  Gastrointestinal: Negative.   Genitourinary: Negative for dysuria,  frequency and urgency.       Positive for vulvar itching and no menses since May.  Musculoskeletal: Negative.   Skin: Negative.   Neurological: Negative.   Endo/Heme/Allergies: Positive for environmental allergies.       Positive for night sweats  Psychiatric/Behavioral: Negative for depression. The patient is nervous/anxious.     Past Medical History:  Past Medical History:  Diagnosis Date  . Abnormal Pap smear of cervix 1998   CIN 1  . Basal cell carcinoma 2012  . Lyme disease   . Migraine   . Palpitations   . Paroxysmal sinus tachycardia (HCC)     Past Surgical History:  Past Surgical History:  Procedure Laterality Date  . BASAL CELL CARCINOMA EXCISION  08/2012   Moh's surgery on bridge of nose and plastic surgery  . CARPAL TUNNEL RELEASE Right 2008  . CARPAL TUNNEL RELEASE Left 09/08/2011  . CESAREAN SECTION  2000, 2006  . COLONOSCOPY  2002   Dr. Vira Agar internal hemorrhoids  . HAND SURGERY Right 07/28/2011   cyst removed  . LASER ABLATION  2010   Dr Dew-varicose vein    Gynecologic History:  Patient's last menstrual period was 02/08/2018 (exact date). Contraception: vasectomy Last Pap: 1 year ago, results were: normal   Last mammogram: 1 year ago, results were: normal Obstetric History: [id number]  Family History:  Family History  Problem Relation Age of Onset  . Diabetes Maternal Grandmother   . Coronary artery disease Maternal Grandmother        Had MI at age 78  .  Heart attack Maternal Grandfather   . Diabetes Paternal Grandmother   . Hyperlipidemia Mother   . Hypertension Mother   . Hypertension Father   . Sudden Cardiac Death Cousin 45    Social History:  Social History   Socioeconomic History  . Marital status: Married    Spouse name: Not on file  . Number of children: 2  . Years of education: Not on file  . Highest education level: Not on file  Occupational History  . Occupation: Pharmacist, hospital  Social Needs  . Financial resource strain: Not on  file  . Food insecurity:    Worry: Not on file    Inability: Not on file  . Transportation needs:    Medical: Not on file    Non-medical: Not on file  Tobacco Use  . Smoking status: Former Research scientist (life sciences)  . Smokeless tobacco: Never Used  Substance and Sexual Activity  . Alcohol use: Yes    Comment: occ  . Drug use: No  . Sexual activity: Yes    Partners: Male    Birth control/protection: Surgical  Lifestyle  . Physical activity:    Days per week: 7 days    Minutes per session: 30 min  . Stress: Only a little  Relationships  . Social connections:    Talks on phone: Not on file    Gets together: Not on file    Attends religious service: Not on file    Active member of club or organization: Not on file    Attends meetings of clubs or organizations: Not on file    Relationship status: Not on file  . Intimate partner violence:    Fear of current or ex partner: Not on file    Emotionally abused: Not on file    Physically abused: Not on file    Forced sexual activity: Not on file  Other Topics Concern  . Not on file  Social History Narrative  . Not on file    Allergies:  Allergies  Allergen Reactions  . Latex     Medications:  Current Outpatient Medications on File Prior to Visit  Medication Sig Dispense Refill  . aspirin EC 81 MG tablet Take 1 tablet (81 mg total) by mouth daily. 150 tablet 2  . fexofenadine-pseudoephedrine (ALLEGRA-D 24) 180-240 MG 24 hr tablet Take 1 tablet by mouth daily.    . metoprolol tartrate (LOPRESSOR) 25 MG tablet Take by mouth.    . valACYclovir (VALTREX) 1000 MG tablet      No current facility-administered medications on file prior to visit.         Physical Exam Vitals: BP 90/60   Pulse (!) 52   Ht 5\' 6"  (1.676 m)   Wt 129 lb (58.5 kg)   LMP 02/08/2018 (Exact Date)   BMI 20.82 kg/m   General:WF in NAD HEENT: normocephalic, anicteric Thyroid: no enlargement, no palpable nodules Pulmonary: No increased work of breathing,  CTAB Cardiovascular: RRR, distal pulses 2+ Breast: Breast symmetrical, no tenderness, no skin or nipple retraction present, no nipple discharge.  No axillary or supraclavicular lymphadenopathy. Right breast with mobile non-tender lump that is approximately 3cm x 4cm at 9-10:00 o'clock. No masses in left breast Abdomen: soft, non-tender, non-distended.  Umbilicus without lesions.  No hepatomegaly or masses palpable. No evidence of hernia  Genitourinary:  External: MIld inflammation of vestibule  Normal urethral meatus, normal Bartholin's and Skene's glands.    Vagina: Normal vaginal mucosa, white clumpy discharge.    Cervix: Grossly  normal in appearance, no bleeding, no CMT  Uterus: AV,Non-enlarged, mobile, normal contour.    Adnexa: ovaries non-enlarged, no adnexal masses  Rectal: deferred  Lymphatic: no evidence of inguinal lymphadenopathy Extremities: no edema, erythema, or tenderness Neurologic: Grossly intact Psychiatric: mood appropriate, affect full   Assessment: 52 y.o. [id number] Well woman exam with PAP smear No menses x 2 months-probably perimenopausal Right breast mass-no change since last year   Plan:  1) Mammogram - recommend yearly screening mammogram.  Mammogram ordered  2) Continue healthy lifestyle diet and exercise  3) ASCCP guidelines and rational discussed.  Patient opts for yearly screening interval. Pap done  4) Contraception - vasectomy  5) Routine healthcare maintenance including cholesterol, diabetes screening discussed managed by PCP   Dalia Heading, CNM

## 2018-04-03 ENCOUNTER — Encounter

## 2018-04-03 ENCOUNTER — Ambulatory Visit (INDEPENDENT_AMBULATORY_CARE_PROVIDER_SITE_OTHER): Payer: BC Managed Care – PPO | Admitting: Certified Nurse Midwife

## 2018-04-03 ENCOUNTER — Other Ambulatory Visit (HOSPITAL_COMMUNITY)
Admission: RE | Admit: 2018-04-03 | Discharge: 2018-04-03 | Disposition: A | Discharge status: Home / Self Care | Payer: BC Managed Care – PPO | Source: Ambulatory Visit | Attending: Obstetrics and Gynecology | Admitting: Obstetrics and Gynecology

## 2018-04-03 ENCOUNTER — Encounter: Payer: Self-pay | Admitting: Certified Nurse Midwife

## 2018-04-03 VITALS — BP 90/60 | HR 52 | Ht 66.0 in | Wt 129.0 lb

## 2018-04-03 DIAGNOSIS — Z01411 Encounter for gynecological examination (general) (routine) with abnormal findings: Secondary | ICD-10-CM

## 2018-04-03 DIAGNOSIS — I471 Supraventricular tachycardia: Secondary | ICD-10-CM

## 2018-04-03 DIAGNOSIS — Z01419 Encounter for gynecological examination (general) (routine) without abnormal findings: Secondary | ICD-10-CM | POA: Diagnosis present

## 2018-04-03 DIAGNOSIS — R61 Generalized hyperhidrosis: Secondary | ICD-10-CM | POA: Diagnosis not present

## 2018-04-03 DIAGNOSIS — I4719 Other supraventricular tachycardia: Secondary | ICD-10-CM

## 2018-04-03 DIAGNOSIS — Z124 Encounter for screening for malignant neoplasm of cervix: Secondary | ICD-10-CM

## 2018-04-03 DIAGNOSIS — Z1231 Encounter for screening mammogram for malignant neoplasm of breast: Secondary | ICD-10-CM

## 2018-04-03 DIAGNOSIS — R450 Nervousness: Secondary | ICD-10-CM | POA: Diagnosis not present

## 2018-04-03 DIAGNOSIS — R079 Chest pain, unspecified: Secondary | ICD-10-CM | POA: Diagnosis not present

## 2018-04-03 DIAGNOSIS — Z1239 Encounter for other screening for malignant neoplasm of breast: Secondary | ICD-10-CM

## 2018-04-03 MED ORDER — CLOTRIMAZOLE-BETAMETHASONE 1-0.05 % EX CREA: 1.0000 "application " | TOPICAL_CREAM | Freq: Two times a day (BID) | CUTANEOUS | 0 refills | Status: AC

## 2018-04-03 MED ORDER — FLUCONAZOLE 150 MG PO TABS: 150.0000 mg | ORAL_TABLET | Freq: Once | ORAL | 0 refills | Status: AC

## 2018-04-04 LAB — CYTOLOGY - PAP: Diagnosis: NEGATIVE

## 2018-04-09 ENCOUNTER — Encounter: Payer: Self-pay | Admitting: Certified Nurse Midwife

## 2018-04-09 DIAGNOSIS — C4491 Basal cell carcinoma of skin, unspecified: Secondary | ICD-10-CM | POA: Insufficient documentation

## 2018-04-09 DIAGNOSIS — I471 Supraventricular tachycardia: Secondary | ICD-10-CM | POA: Insufficient documentation

## 2018-04-09 DIAGNOSIS — G43909 Migraine, unspecified, not intractable, without status migrainosus: Secondary | ICD-10-CM | POA: Insufficient documentation

## 2018-04-10 ENCOUNTER — Other Ambulatory Visit: Payer: Self-pay | Admitting: Certified Nurse Midwife

## 2018-04-10 DIAGNOSIS — Z1231 Encounter for screening mammogram for malignant neoplasm of breast: Secondary | ICD-10-CM

## 2018-04-11 ENCOUNTER — Ambulatory Visit
Admission: RE | Admit: 2018-04-11 | Discharge: 2018-04-11 | Disposition: A | Discharge status: Home / Self Care | Payer: BC Managed Care – PPO | Source: Ambulatory Visit | Attending: Certified Nurse Midwife | Admitting: Certified Nurse Midwife

## 2018-04-11 DIAGNOSIS — Z1231 Encounter for screening mammogram for malignant neoplasm of breast: Secondary | ICD-10-CM | POA: Diagnosis present

## 2018-04-11 DIAGNOSIS — Z1239 Encounter for other screening for malignant neoplasm of breast: Secondary | ICD-10-CM

## 2018-04-25 ENCOUNTER — Encounter
Admission: RE | Disposition: A | Discharge status: Home / Self Care | Payer: Self-pay | Source: Ambulatory Visit | Attending: Internal Medicine

## 2018-04-25 ENCOUNTER — Ambulatory Visit
Admission: RE | Admit: 2018-04-25 | Discharge: 2018-04-25 | Disposition: A | Discharge status: Home / Self Care | Payer: BC Managed Care – PPO | Source: Ambulatory Visit | Attending: Internal Medicine | Admitting: Internal Medicine

## 2018-04-25 DIAGNOSIS — Z87891 Personal history of nicotine dependence: Secondary | ICD-10-CM | POA: Diagnosis not present

## 2018-04-25 DIAGNOSIS — Z79899 Other long term (current) drug therapy: Secondary | ICD-10-CM | POA: Insufficient documentation

## 2018-04-25 DIAGNOSIS — R002 Palpitations: Secondary | ICD-10-CM | POA: Insufficient documentation

## 2018-04-25 DIAGNOSIS — R5383 Other fatigue: Secondary | ICD-10-CM | POA: Diagnosis not present

## 2018-04-25 DIAGNOSIS — Z91040 Latex allergy status: Secondary | ICD-10-CM | POA: Diagnosis not present

## 2018-04-25 DIAGNOSIS — I209 Angina pectoris, unspecified: Secondary | ICD-10-CM | POA: Insufficient documentation

## 2018-04-25 DIAGNOSIS — Z833 Family history of diabetes mellitus: Secondary | ICD-10-CM | POA: Insufficient documentation

## 2018-04-25 DIAGNOSIS — I208 Other forms of angina pectoris: Secondary | ICD-10-CM

## 2018-04-25 DIAGNOSIS — I471 Supraventricular tachycardia: Secondary | ICD-10-CM | POA: Diagnosis not present

## 2018-04-25 DIAGNOSIS — Z8249 Family history of ischemic heart disease and other diseases of the circulatory system: Secondary | ICD-10-CM | POA: Insufficient documentation

## 2018-04-25 DIAGNOSIS — F411 Generalized anxiety disorder: Secondary | ICD-10-CM | POA: Insufficient documentation

## 2018-04-25 DIAGNOSIS — R000 Tachycardia, unspecified: Secondary | ICD-10-CM

## 2018-04-25 HISTORY — PX: LEFT HEART CATH AND CORONARY ANGIOGRAPHY: CATH118249

## 2018-04-25 SURGERY — LEFT HEART CATH AND CORONARY ANGIOGRAPHY
Anesthesia: Moderate Sedation | Laterality: Left

## 2018-04-25 SURGERY — LEFT HEART CATH AND CORONARY ANGIOGRAPHY
Anesthesia: Moderate Sedation

## 2018-04-25 MED ORDER — IOPAMIDOL (ISOVUE-300) INJECTION 61%
INTRAVENOUS | Status: DC | PRN
  Administered 2018-04-25: 70 mL via INTRA_ARTERIAL

## 2018-04-25 MED ORDER — ASPIRIN 81 MG PO CHEW
CHEWABLE_TABLET | ORAL | Status: AC
  Administered 2018-04-25: 07:00:00
  Filled 2018-04-25: qty 1

## 2018-04-25 MED ORDER — SODIUM CHLORIDE 0.9 % IV SOLN: 250.0000 mL | INTRAVENOUS | Status: DC | PRN

## 2018-04-25 MED ORDER — SODIUM CHLORIDE 0.9 % WEIGHT BASED INFUSION
3.0000 mL/kg/h | INTRAVENOUS | Status: AC
  Administered 2018-04-25: 3 mL/kg/h via INTRAVENOUS

## 2018-04-25 MED ORDER — MIDAZOLAM HCL 2 MG/2ML IJ SOLN
INTRAMUSCULAR | Status: AC
  Filled 2018-04-25: qty 2

## 2018-04-25 MED ORDER — SODIUM CHLORIDE 0.9% FLUSH: 3.0000 mL | Freq: Two times a day (BID) | INTRAVENOUS | Status: DC

## 2018-04-25 MED ORDER — HEPARIN SODIUM (PORCINE) 1000 UNIT/ML IJ SOLN
INTRAMUSCULAR | Status: AC
  Filled 2018-04-25: qty 1

## 2018-04-25 MED ORDER — MIDAZOLAM HCL 2 MG/2ML IJ SOLN
INTRAMUSCULAR | Status: DC | PRN
  Administered 2018-04-25: 1 mg via INTRAVENOUS

## 2018-04-25 MED ORDER — HEPARIN (PORCINE) IN NACL 1000-0.9 UT/500ML-% IV SOLN
INTRAVENOUS | Status: AC
  Filled 2018-04-25: qty 1000

## 2018-04-25 MED ORDER — LIDOCAINE HCL (PF) 1 % IJ SOLN
INTRAMUSCULAR | Status: DC | PRN
  Administered 2018-04-25: 18 mL via INTRADERMAL

## 2018-04-25 MED ORDER — VERAPAMIL HCL 2.5 MG/ML IV SOLN
INTRAVENOUS | Status: AC
  Filled 2018-04-25: qty 2

## 2018-04-25 MED ORDER — FENTANYL CITRATE (PF) 100 MCG/2ML IJ SOLN
INTRAMUSCULAR | Status: AC
  Filled 2018-04-25: qty 2

## 2018-04-25 MED ORDER — ASPIRIN 81 MG PO CHEW: 81.0000 mg | CHEWABLE_TABLET | ORAL | Status: DC

## 2018-04-25 MED ORDER — SODIUM CHLORIDE 0.9 % WEIGHT BASED INFUSION: 1.0000 mL/kg/h | INTRAVENOUS | Status: DC

## 2018-04-25 MED ORDER — LIDOCAINE HCL (PF) 1 % IJ SOLN
INTRAMUSCULAR | Status: AC
  Filled 2018-04-25: qty 30

## 2018-04-25 MED ORDER — SODIUM CHLORIDE 0.9% FLUSH: 3.0000 mL | INTRAVENOUS | Status: DC | PRN

## 2018-04-25 SURGICAL SUPPLY — 11 items
CATH INFINITI 5FR ANG PIGTAIL (CATHETERS) ×2 IMPLANT
CATH INFINITI 5FR JL4 (CATHETERS) ×2 IMPLANT
CATH INFINITI JR4 5F (CATHETERS) ×2 IMPLANT
DEVICE SAFEGUARD 24CM (GAUZE/BANDAGES/DRESSINGS) ×2 IMPLANT
KIT MANI 3VAL PERCEP (MISCELLANEOUS) ×2 IMPLANT
NEEDLE PERC 18GX7CM (NEEDLE) ×2 IMPLANT
NEEDLE PERC 21GX4CM (NEEDLE) ×2 IMPLANT
PACK CARDIAC CATH (CUSTOM PROCEDURE TRAY) ×2 IMPLANT
SHEATH AVANTI 5FR X 11CM (SHEATH) ×2 IMPLANT
SHEATH RAIN RADIAL 21G 6FR (SHEATH) IMPLANT
WIRE ROSEN-J .035X260CM (WIRE) IMPLANT

## 2018-04-25 NOTE — OR Nursing (Signed)
PAD removed , site dressed per protocol and remains stable at time of discharge

## 2018-04-25 NOTE — Discharge Instructions (Signed)

## 2018-04-26 ENCOUNTER — Encounter: Payer: Self-pay | Admitting: Internal Medicine

## 2018-09-20 DIAGNOSIS — R58 Hemorrhage, not elsewhere classified: Secondary | ICD-10-CM

## 2018-09-20 DIAGNOSIS — K683 Retroperitoneal hematoma: Secondary | ICD-10-CM

## 2018-09-20 HISTORY — PX: ATRIAL FIBRILLATION ABLATION: EP1191

## 2018-09-20 HISTORY — DX: Retroperitoneal hematoma: K68.3

## 2018-09-20 HISTORY — DX: Hemorrhage, not elsewhere classified: R58

## 2019-05-03 ENCOUNTER — Ambulatory Visit
Admission: RE | Admit: 2019-05-03 | Discharge: 2019-05-03 | Disposition: A | Discharge status: Home / Self Care | Payer: BC Managed Care – PPO | Source: Ambulatory Visit | Attending: Certified Nurse Midwife | Admitting: Certified Nurse Midwife

## 2019-05-03 ENCOUNTER — Other Ambulatory Visit: Payer: Self-pay | Admitting: Certified Nurse Midwife

## 2019-05-03 ENCOUNTER — Ambulatory Visit (INDEPENDENT_AMBULATORY_CARE_PROVIDER_SITE_OTHER): Payer: BC Managed Care – PPO | Admitting: Certified Nurse Midwife

## 2019-05-03 ENCOUNTER — Encounter: Payer: Self-pay | Admitting: Certified Nurse Midwife

## 2019-05-03 ENCOUNTER — Other Ambulatory Visit: Payer: Self-pay

## 2019-05-03 ENCOUNTER — Encounter

## 2019-05-03 VITALS — BP 98/68 | Ht 66.0 in | Wt 144.2 lb

## 2019-05-03 DIAGNOSIS — Z01419 Encounter for gynecological examination (general) (routine) without abnormal findings: Secondary | ICD-10-CM

## 2019-05-03 DIAGNOSIS — B3731 Acute candidiasis of vulva and vagina: Secondary | ICD-10-CM

## 2019-05-03 DIAGNOSIS — Z1231 Encounter for screening mammogram for malignant neoplasm of breast: Secondary | ICD-10-CM | POA: Diagnosis not present

## 2019-05-03 DIAGNOSIS — Z124 Encounter for screening for malignant neoplasm of cervix: Secondary | ICD-10-CM

## 2019-05-03 DIAGNOSIS — B373 Candidiasis of vulva and vagina: Secondary | ICD-10-CM

## 2019-05-03 DIAGNOSIS — Z1211 Encounter for screening for malignant neoplasm of colon: Secondary | ICD-10-CM

## 2019-05-03 DIAGNOSIS — Z1239 Encounter for other screening for malignant neoplasm of breast: Secondary | ICD-10-CM

## 2019-05-03 DIAGNOSIS — L292 Pruritus vulvae: Secondary | ICD-10-CM

## 2019-05-03 MED ORDER — VALACYCLOVIR HCL 1 G PO TABS: ORAL_TABLET | ORAL | 2 refills | Status: AC

## 2019-05-03 MED ORDER — TERCONAZOLE 0.8 % VA CREA: 1.0000 | TOPICAL_CREAM | Freq: Every day | VAGINAL | 0 refills | Status: AC

## 2019-05-03 MED ORDER — SUMATRIPTAN SUCCINATE 50 MG PO TABS: ORAL_TABLET | ORAL | 3 refills | Status: AC

## 2019-05-03 NOTE — Patient Instructions (Signed)
Preventing Osteoporosis, Adult Osteoporosis is a condition that causes the bones to get weaker. With osteoporosis, the bones become thinner, and the normal spaces in bone tissue become larger. This can make the bones weak and cause them to break more easily. People who have osteoporosis are more likely to break their wrist, spine, or hip. Even a minor accident or injury can be enough to break weak bones. Osteoporosis can occur with aging. Your body constantly replaces old bone tissue with new tissue. As you get older, you may lose bone tissue more quickly, or it may be replaced more slowly. Osteoporosis is more likely to develop if you have poor nutrition or do not get enough calcium or vitamin D. Other lifestyle factors can also play a role. By making some diet and lifestyle changes, you can help to keep your bones healthy and help to prevent osteoporosis. What nutrition changes can be made? Nutrition plays an important role in maintaining healthy, strong bones.  Make sure you get enough calcium every day from food or from calcium supplements. ? If you are age 64 or younger, aim to get 1,000 mg of calcium every day. ? If you are older than age 31, aim to get 1,200 mg of calcium every day.  Try to get enough vitamin D every day. ? If you are age 42 or younger, aim to get 600 international units (IU) every day. ? If you are older than age 56, aim to get 800 international units every day.  Follow a healthy diet. Eat plenty of foods that contain calcium and vitamin D. ? Calcium is in milk, cheese, yogurt, and other dairy products. Some fish and vegetables are also good sources of calcium. Many foods such as cereals and breads have had calcium added to them (are fortified). Check nutrition labels to see how much calcium is in a food or drink. ? Foods that contain vitamin D include milk, cereals, salmon, and tuna. Your body also makes vitamin D when you are out in the sun. Bare skin exposure to the sun on  your face, arms, legs, or back for no more than 30 minutes a day, 2 times per week is more than enough. Beyond that, it is important to use sunblock to protect your skin from sunburn, which increases your risk for skin cancer. What lifestyle changes can be made? Making changes in your everyday life can also play an important role in preventing osteoporosis.  Stay active and get exercise every day. Ask your health care provider what types of exercise are best for you.  Do not use any products that contain nicotine or tobacco, such as cigarettes and e-cigarettes. If you need help quitting, ask your health care provider.  Limit alcohol intake to no more than 1 drink a day for nonpregnant women and 2 drinks a day for men. One drink equals 12 oz of beer, 5 oz of wine, or 1 oz of hard liquor. Why are these changes important? Making these nutrition and lifestyle changes can:  Help you develop and maintain healthy, strong bones.  Prevent loss of bone mass and the problems that are caused by that loss, such as broken bones and delayed healing.  Make you feel better mentally and physically. What can happen if changes are not made? Problems that can result from osteoporosis can be very serious. These may include:  A higher risk of broken bones that are painful and do not heal well.  Physical malformations, such as a collapsed spine  or a hunched back.  Problems with movement. Where to find support If you need help making changes to prevent osteoporosis, talk with your health care provider. You can ask for a referral to a diet and nutrition specialist (dietitian) and a physical therapist. Where to find more information Learn more about osteoporosis from:  NIH Osteoporosis and Related [place]: www.niams.GolfingGoddess.com.br  U.S. Office on Women's Health:  SouvenirBaseball.es.html  National Osteoporosis Foundation: ProfilePeek.ch Summary  Osteoporosis is a condition that causes weak bones that are more likely to break.  Eating a healthy diet and making sure you get enough calcium and vitamin D can help prevent osteoporosis.  Other ways to reduce your risk of osteoporosis include getting regular exercise and avoiding alcohol and products that contain nicotine or tobacco. This information is not intended to replace advice given to you by your health care provider. Make sure you discuss any questions you have with your health care provider. Document Released: 10/05/2015 Document Revised: 09/02/2017 Document Reviewed: 05/31/2016 Elsevier Patient Education  2020 [employer].

## 2019-05-03 NOTE — Progress Notes (Signed)
Patient ID: Donna Reid, female   DOB: 1966-01-28, 53 y.o.   MRN: [id number]      Gynecology Annual Exam  PCP: System, Pcp Not In  Chief Complaint:  Chief Complaint  Patient presents with  . Gynecologic Exam    History of Present Illness: Patient is a 53 y.o. WF G2P2002 who presents for her annual gyn exam. The patient reports that last summer her menses skipped a couple of months, but that they returned after July and her hot flashes have resolved. Her menses are now monthly, lasting 7 days, and can be light to heavy. Heavier bleeding requires tampon change 5-6x/day. Menses in the last 6 months have been heavier with more cramping since starting anticoagulation for atrial fibrillation. She takes analgesia for cramping with relief.  She also complains of some mild vulvar itching and irritation recently.  Last Pap smear was 04/03/2018 and was NIL. Remote history of CIN1 in 1998  Her past medical history is remarkable for allergic rhinitis, migraine headaches,SVT, and basal cell carcinoma. S She just had an echo and stress test. he was diagnosed with a fib last year and had an ablation. This procedure was complicated by a retroperitoneal bleed requiring a unit of blood and hospitalization. The patient is sexually active. She currently uses vasectomy for contraception. She denies dyspareunia.  The patient does perform self breast exams. She continues to palpate the right breast mass that was diagnosed as a cluster of cysts at time of her diagnostic mammogram 03/14/2017. Last mammogram 04/11/2018 was benign with no changes.  There is no notable family history of breast or ovarian cancer in her family.  The patient wears seatbelts: yes.   The patient has regular exercise: yes, does water aerobics.  The patient admits to occasional alcohol use. Denies tobacco use.   The patient denies current symptoms of depression, but has been anxoius recently regarding her tachycardia, palpitations.   She had  a lipid panel in 2017 that was normal.  Review of Systems: Review of Systems  Constitutional: Negative.   HENT: Negative for congestion.   Eyes: Positive for blurred vision.  Respiratory: Negative.   Cardiovascular: Positive for palpitations and orthopnea. Negative for chest pain.  Gastrointestinal: Negative.   Genitourinary: Negative for dysuria, frequency and urgency.       Positive for vulvar itching  Musculoskeletal: Negative.   Skin: Negative.   Neurological: Negative.   Endo/Heme/Allergies: Positive for environmental allergies.  Psychiatric/Behavioral: Negative for depression. The patient is not nervous/anxious.     Past Medical History:  Past Medical History:  Diagnosis Date  . Abnormal Pap smear of cervix 1998   CIN 1  . Basal cell carcinoma 2012  . Lyme disease   . Migraine   . Palpitations   . Paroxysmal atrial fibrillation ([place]) 2019  . Paroxysmal sinus tachycardia ([place])   . Retroperitoneal bleed 09/20/2018   after cardiac ablation-bleed from right groin. Received one unit of PRBCs    Past Surgical History:  Past Surgical History:  Procedure Laterality Date  . ATRIAL FIBRILLATION ABLATION  09/20/2018  . BASAL CELL CARCINOMA EXCISION  08/2012   Moh's surgery on bridge of nose and plastic surgery  . CARPAL TUNNEL RELEASE Right 2008  . CARPAL TUNNEL RELEASE Left 09/08/2011  . CESAREAN SECTION  2000, 2006  . COLONOSCOPY  2002   Dr. Vira Agar internal hemorrhoids  . HAND SURGERY Right 07/28/2011   cyst removed  . LASER ABLATION  2010   Dr Dew-varicose vein  .  LEFT HEART CATH AND CORONARY ANGIOGRAPHY Left 04/25/2018   Procedure: LEFT HEART CATH AND CORONARY ANGIOGRAPHY;  Surgeon: Yolonda Kida, MD;  Location: [facility];  Service: Cardiovascular;  Laterality: Left;    Gynecologic History:  Patient's last menstrual period was 04/16/2019 (approximate). Contraception: vasectomy Last Pap: 1 year ago, results were: normal   Last mammogram: 1 year  ago, results were: benign Obstetric History: [id number]  Family History:  Family History  Problem Relation Age of Onset  . Diabetes Maternal Grandmother   . Coronary artery disease Maternal Grandmother        Had MI at age 4  . Heart attack Maternal Grandfather   . Diabetes Paternal Grandmother   . Hyperlipidemia Mother   . Hypertension Mother   . Hypertension Father   . Sudden Cardiac Death Cousin 66  . Breast cancer Neg Hx     Social History:  Social History   Socioeconomic History  . Marital status: Married    Spouse name: Not on file  . Number of children: 2  . Years of education: Not on file  . Highest education level: Not on file  Occupational History  . Occupation: Pharmacist, hospital  Social Needs  . Financial resource strain: Not on file  . Food insecurity    Worry: Not on file    Inability: Not on file  . Transportation needs    Medical: Not on file    Non-medical: Not on file  Tobacco Use  . Smoking status: Former Research scientist (life sciences)  . Smokeless tobacco: Never Used  Substance and Sexual Activity  . Alcohol use: Yes    Comment: occ  . Drug use: No  . Sexual activity: Yes    Partners: Male    Birth control/protection: Surgical  Lifestyle  . Physical activity    Days per week: 7 days    Minutes per session: 30 min  . Stress: Only a little  Relationships  . Social Herbalist on phone: Not on file    Gets together: Not on file    Attends religious service: Not on file    Active member of club or organization: Not on file    Attends meetings of clubs or organizations: Not on file    Relationship status: Not on file  . Intimate partner violence    Fear of current or ex partner: Not on file    Emotionally abused: Not on file    Physically abused: Not on file    Forced sexual activity: Not on file  Other Topics Concern  . Not on file  Social History Narrative  . Not on file    Allergies:  Allergies  Allergen Reactions  . Rivaroxaban Hives and Rash  .  Andexanet Alfa Hives    Xarelto,Apixaban and Edoxaban  . Latex Rash and Itching    Medications:  Current Outpatient Medications on File Prior to Visit  Medication Sig Dispense Refill  . ALPRAZolam (XANAX) 0.25 MG tablet alprazolam 0.25 mg tablet    . clotrimazole-betamethasone (LOTRISONE) cream Apply 1 application topically 2 (two) times daily. (Patient taking differently: Apply 1 application topically 2 (two) times daily as needed (yeast). ) 30 g 0  . dabigatran (PRADAXA) 150 MG CAPS capsule Pradaxa 150 mg capsule    . flecainide (TAMBOCOR) 100 MG tablet     . ipratropium (ATROVENT) 0.03 % nasal spray ipratropium bromide 0.03 % nasal spray    . midodrine (PROAMATINE) 2.5 MG tablet Take by mouth.  No current facility-administered medications on file prior to visit.         Physical Exam Vitals: BP 98/68   Ht 5\' 6"  (1.676 m)   Wt 144 lb 3.2 oz (65.4 kg)   LMP 04/16/2019 (Approximate)   BMI 23.27 kg/m   General:WF in NAD HEENT: normocephalic, anicteric Thyroid: no enlargement, no palpable nodules Pulmonary: No increased work of breathing, CTAB Cardiovascular: RRR, distal pulses 2+ Breast: Breast symmetrical, no tenderness, no skin or nipple retraction present, no nipple discharge.  No axillary or supraclavicular lymphadenopathy. Right breast with several small masses that are in a 3cm x 4cm area at 9-10:00 o'clock (similar to last year). No masses in left breast Abdomen: soft, non-tender, non-distended.  Umbilicus without lesions.  No hepatomegaly or masses palpable. No evidence of hernia  Genitourinary:  External: MIld inflammation of vestibule  Normal urethral meatus, normal Bartholin's and Skene's glands.    Vagina: Normal vaginal mucosa, white milky discharge    Cervix: Grossly normal in appearance, no bleeding, no CMT  Uterus: AV,Non-enlarged, mobile, normal contour.    Adnexa: ovaries non-enlarged, no adnexal masses  Rectal: deferred  Lymphatic: no evidence of  inguinal lymphadenopathy Extremities: no edema, erythema, or tenderness Neurologic: Grossly intact Psychiatric: mood appropriate, affect full Wet prep positive for many budding yeast, negative for Trich and clue cells  Assessment: 54 y.o. [id number] Well woman exam with PAP smear Monilial vulvovaginitis Right breast mass-no change over last 2 years   Plan:  1) Mammogram - recommend monthly breast exams and yearly screening mammogram.  Mammogram ordered  2) Continue healthy lifestyle diet and exercise  3) cervical cancer screening: Patient opts for yearly screening interval. Pap done  4) Contraception - vasectomy  5) Routine healthcare maintenance including cholesterol, diabetes screening discussed managed by PCP   6) Colon cancer screening: Discussed options and patient desires Cologuard  7) Terazol 3 cream for monilia. Refilled Imitrex and Valtrex.  8) RTO 1 year and prn  Dalia Heading, CNM

## 2019-05-06 ENCOUNTER — Encounter: Payer: Self-pay | Admitting: Certified Nurse Midwife

## 2019-05-06 LAB — POCT WET PREP (WET MOUNT): Trichomonas Wet Prep HPF POC: ABSENT

## 2019-05-10 LAB — IGP, APTIMA HPV: HPV Aptima: NEGATIVE

## 2019-05-23 IMAGING — CR DG CHEST 2V
1 series · 2 of 2 positions shown · non-contrast
Comparison: 07/27/2005

CLINICAL DATA: Chest pain

EXAM:
CHEST - 2 VIEW

[Series 1: dg chest 2 view · 0.14mm/px · 2 of 2 slices shown]
[im 1/2]
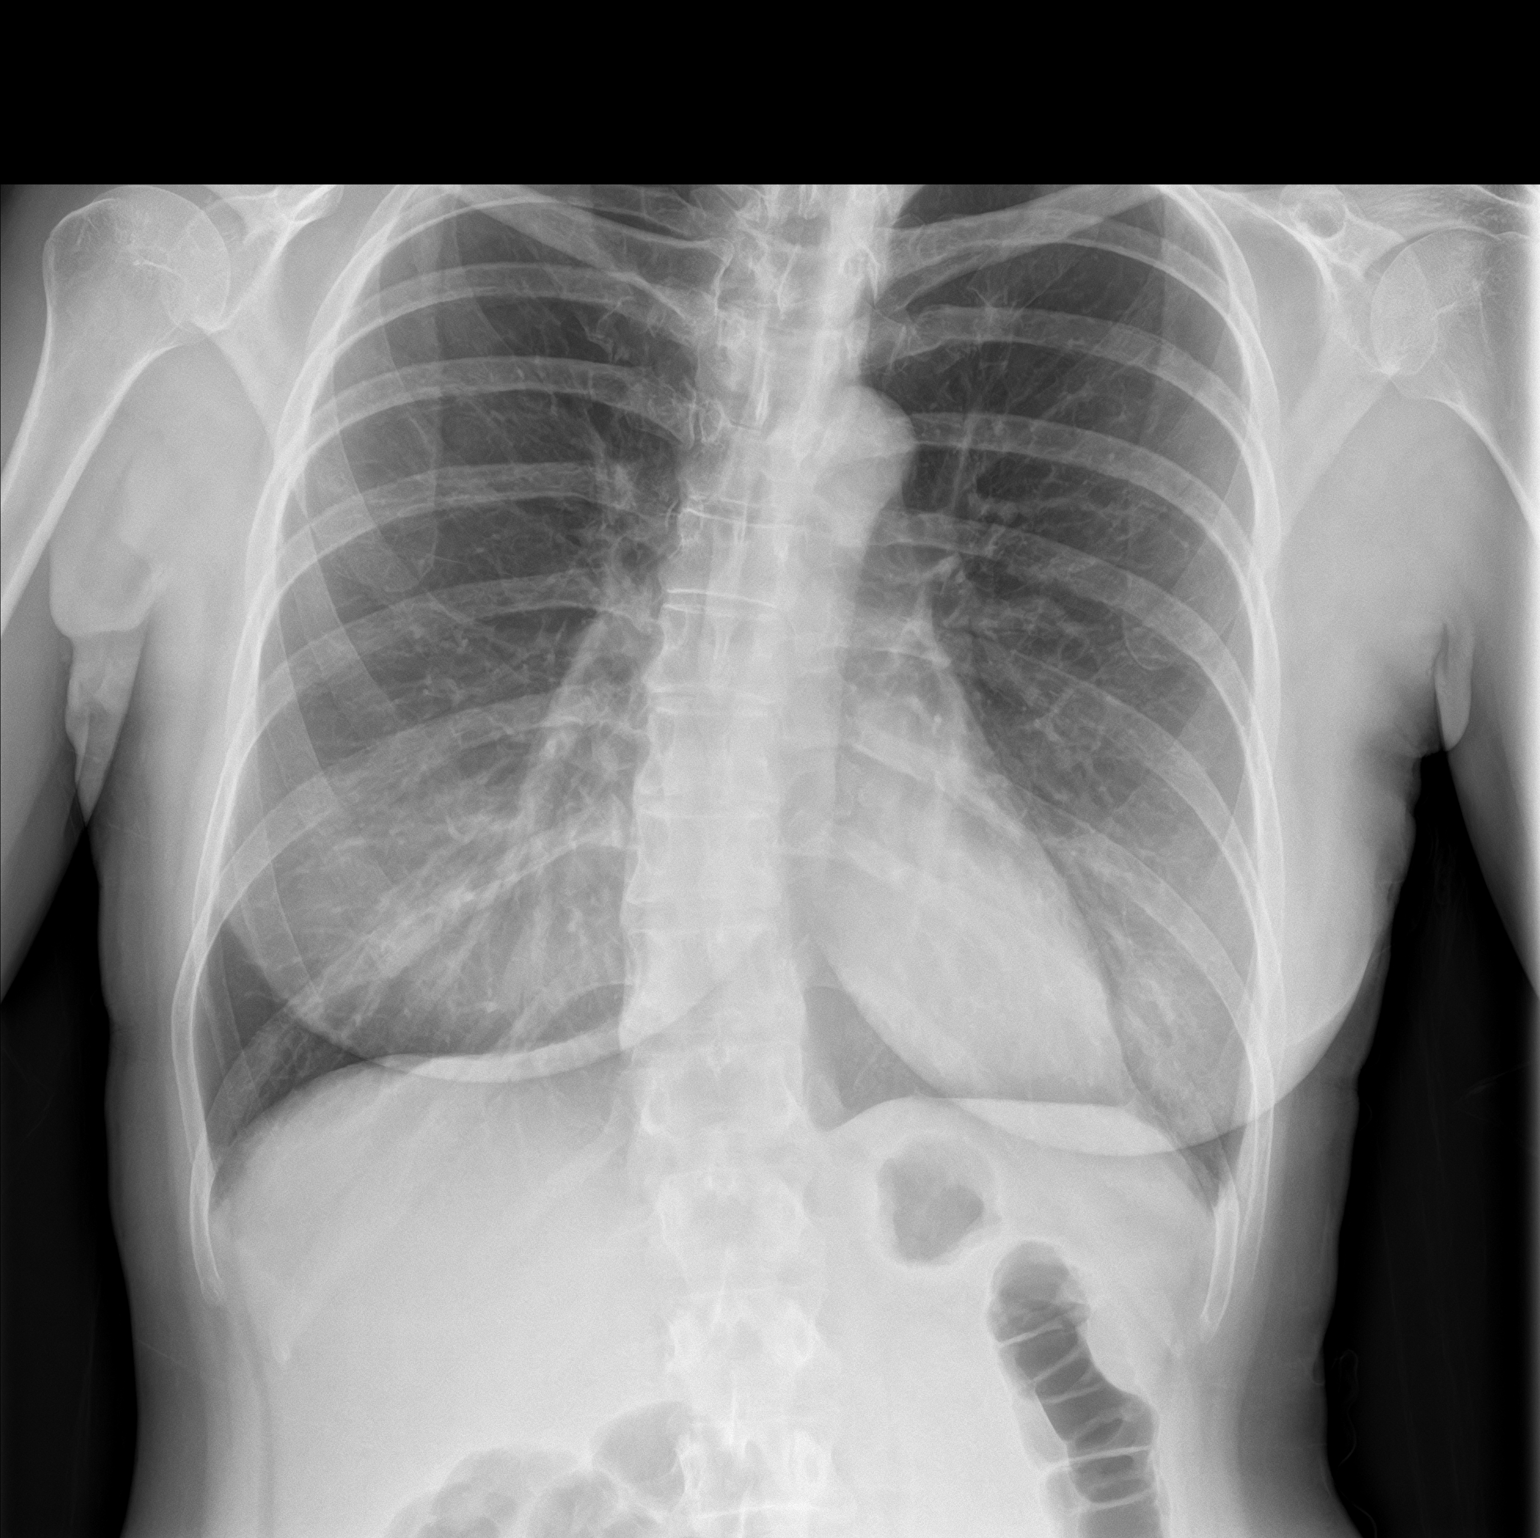
[im 2/2]
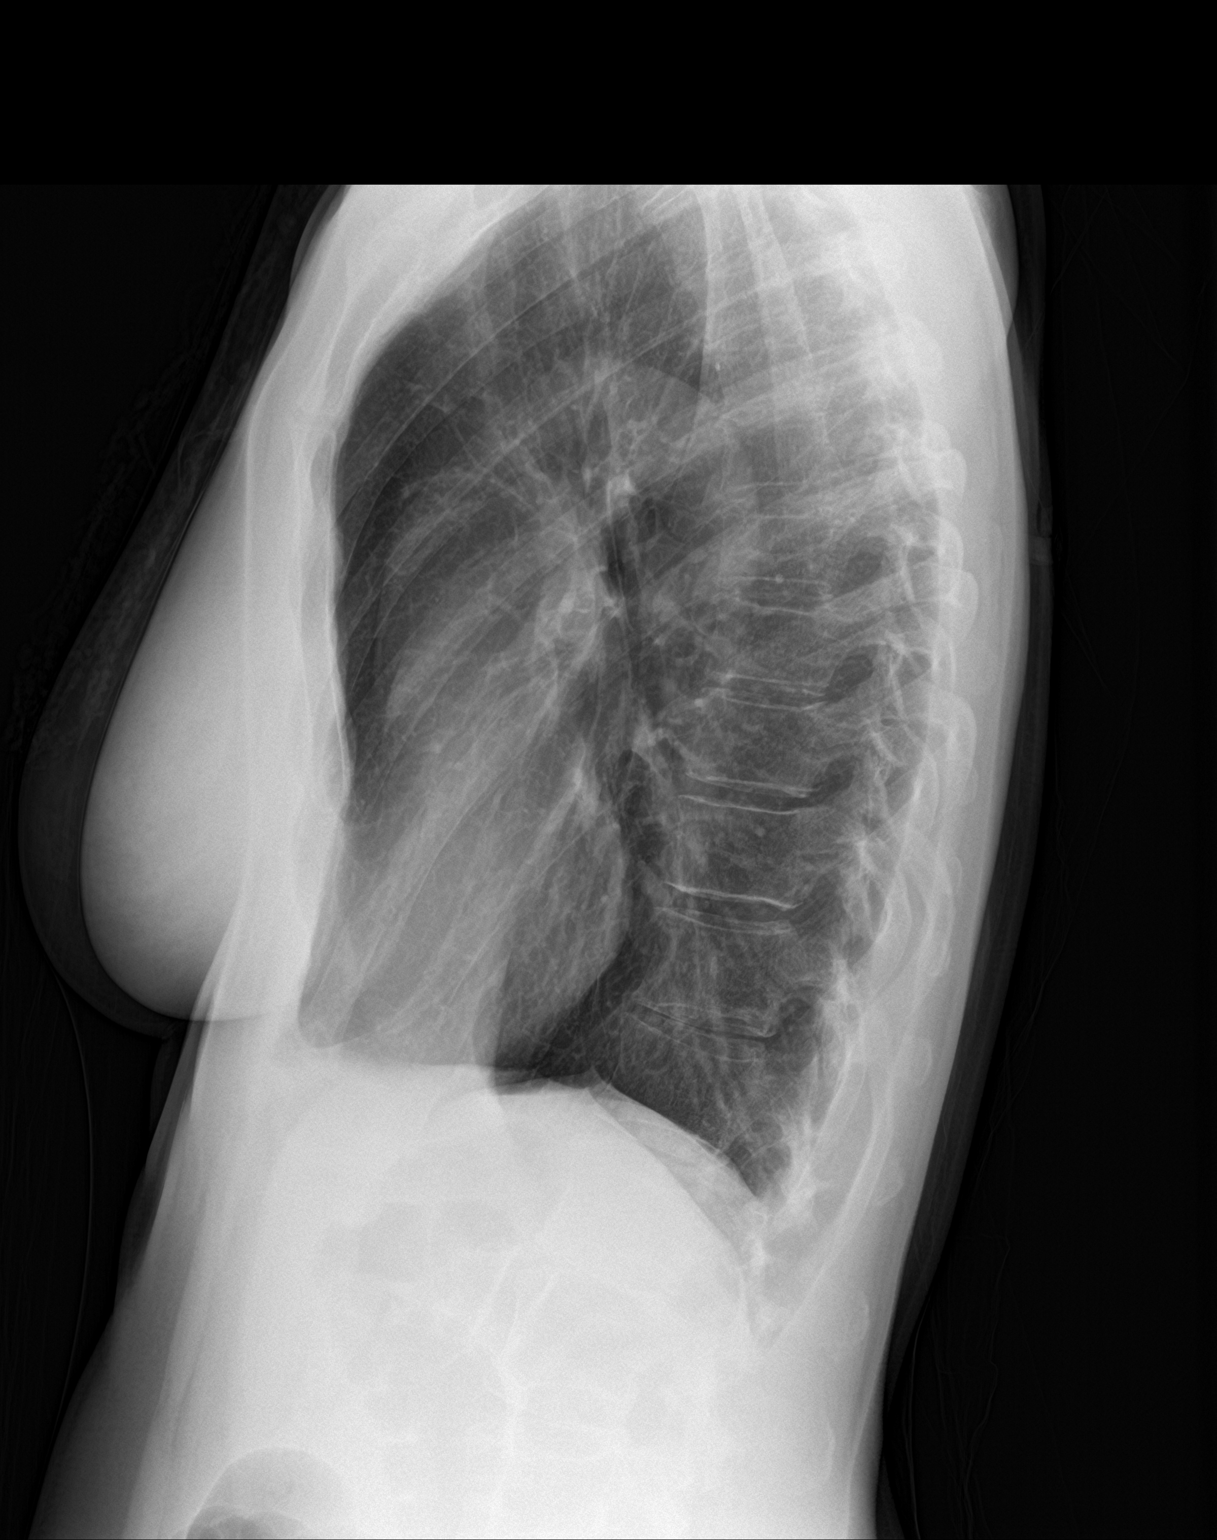

[2 of 2 positions shown; findings below may reference images not displayed]

FINDINGS: Hyperinflation. No focal opacity or pleural effusion. Pectus
deformity of the lower anterior chest wall contributing to increased
opacity at the right cardiac border. No pneumothorax. Mild scoliosis
of the spine
IMPRESSION: No active cardiopulmonary disease.

## 2019-05-25 LAB — COLOGUARD

## 2019-05-29 ENCOUNTER — Telehealth: Payer: Self-pay | Admitting: Certified Nurse Midwife

## 2019-05-29 DIAGNOSIS — Z87898 Personal history of other specified conditions: Secondary | ICD-10-CM

## 2019-05-29 DIAGNOSIS — N6310 Unspecified lump in the right breast, unspecified quadrant: Secondary | ICD-10-CM

## 2019-05-29 NOTE — Telephone Encounter (Signed)
Patient is calling for labs results. Please advise. 

## 2019-05-30 ENCOUNTER — Encounter: Payer: Self-pay | Admitting: Certified Nurse Midwife

## 2019-05-30 NOTE — Telephone Encounter (Signed)
Patient is calling to follow up with CLG for results. Patient aware CLG out of the office and at the hospital. Sending link to patient to active mychart. Please call patient. Thank you

## 2019-05-30 NOTE — Telephone Encounter (Signed)
Mammogram was negative, but no notation made of masses in right breast that were previously were found to be cysts on 2018 mammogram/ultrasound. Would like to get right ultrasound to follow up on palpable masses in the same area. Will try to get in for ultrasound on Friday as her medical insurance is changing after Friday.

## 2019-05-30 NOTE — Telephone Encounter (Signed)
Patient called and discussed mammogram results and my recommendations to get a ultrasound on the right breast to follow up on the right breast mass that is in the same area where she had breast cysts on 2018 ultrasound. Mamogram was negative.

## 2019-05-31 NOTE — Telephone Encounter (Signed)
Patient is calling to get ultrasound schedule. Patient aware Izora Gala would is out of the office this  morning. Patient is requested appointment for tomorrow. And would like a stat order place. Please advise

## 2019-06-01 NOTE — Telephone Encounter (Signed)
Discussed with mammogram technician yesterday the results from the last mammogram for [facility]. She assures me that if any changes were seen, the radiologist would have recommended additional views and an ultrasound. They did not think that an ultrasound was needed as this time. Raveen was called and advised of those recommendations. Will cancel the order for the ultrasound. Dalia Heading, CNM

## 2019-06-04 ENCOUNTER — Telehealth: Payer: Self-pay

## 2019-06-04 NOTE — Telephone Encounter (Signed)
P/c to pt; pt aware of negative Cologuard results.

## 2020-08-07 IMAGING — MG DIGITAL SCREENING BILATERAL MAMMOGRAM WITH TOMO AND CAD
6 of 10 series · 6 of 30 positions shown · non-contrast
Comparison: Previous exam(s).

CLINICAL DATA: Screening.

EXAM:
DIGITAL SCREENING BILATERAL MAMMOGRAM WITH TOMO AND CAD

[R MLO synth-2D]
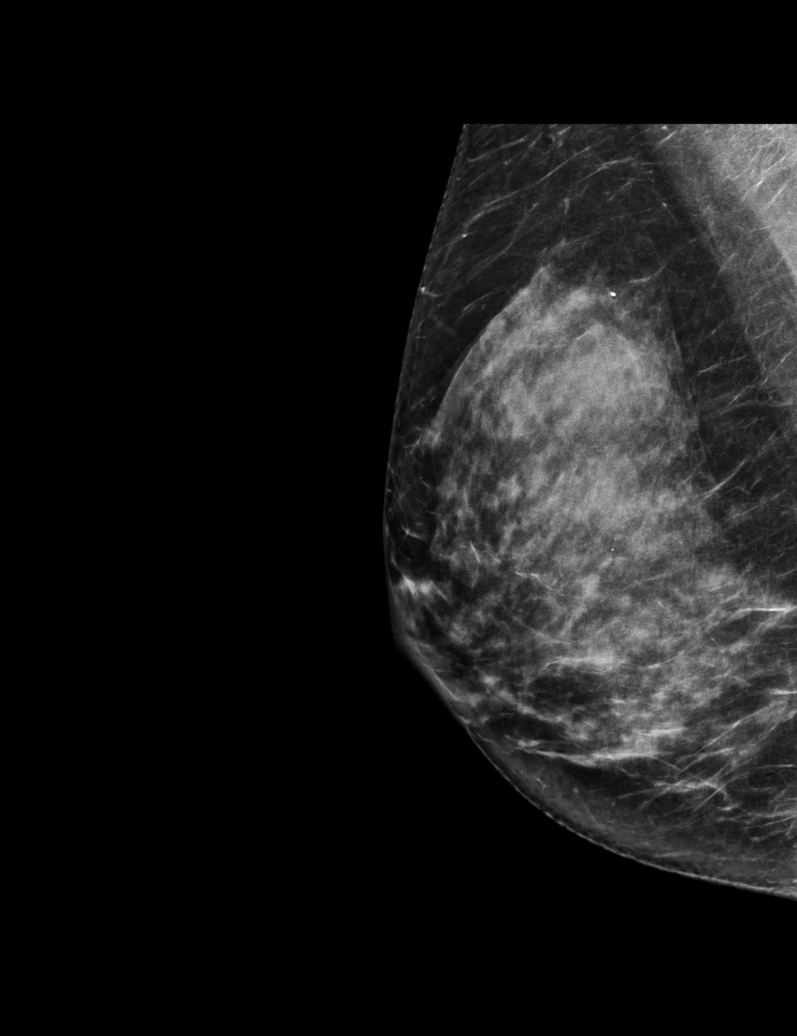

[R CC synth-2D (1 of 2)]
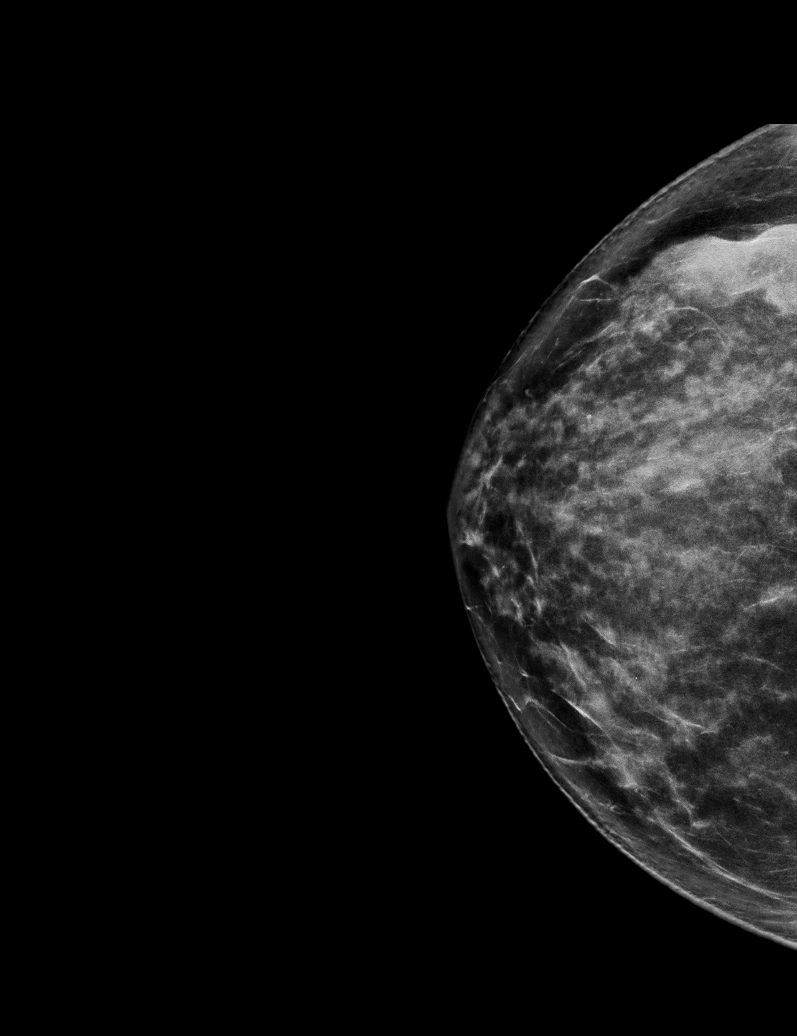

[L CC synth-2D]
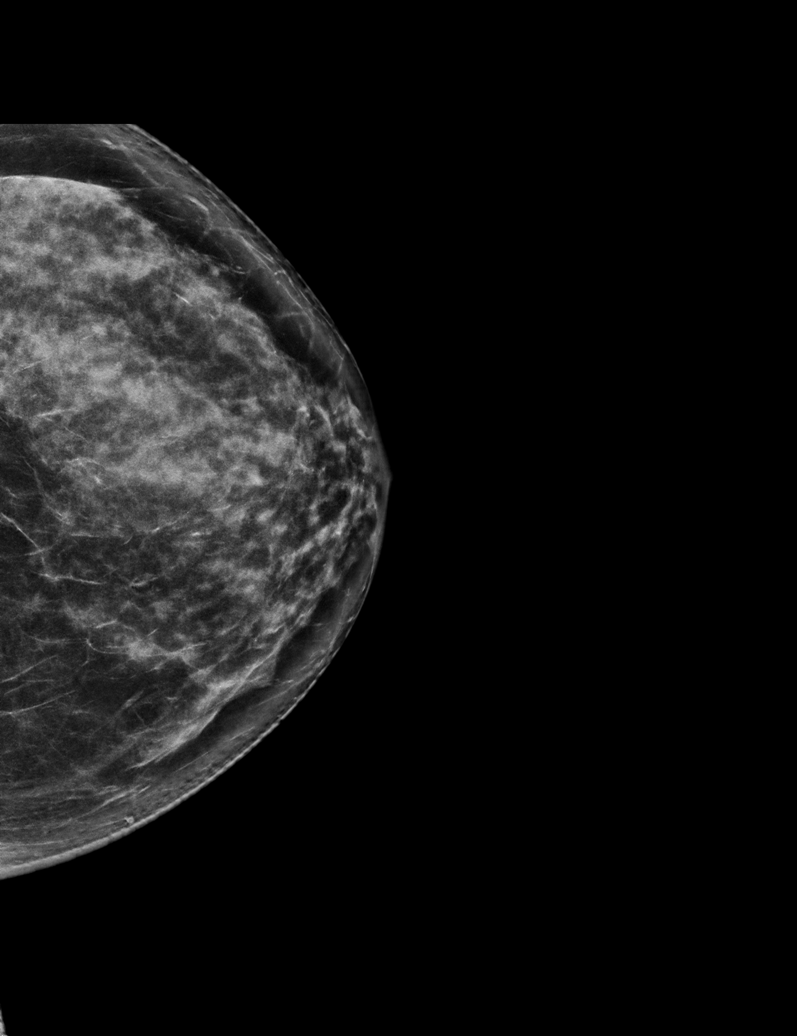

[R CC synth-2D (2 of 2)]
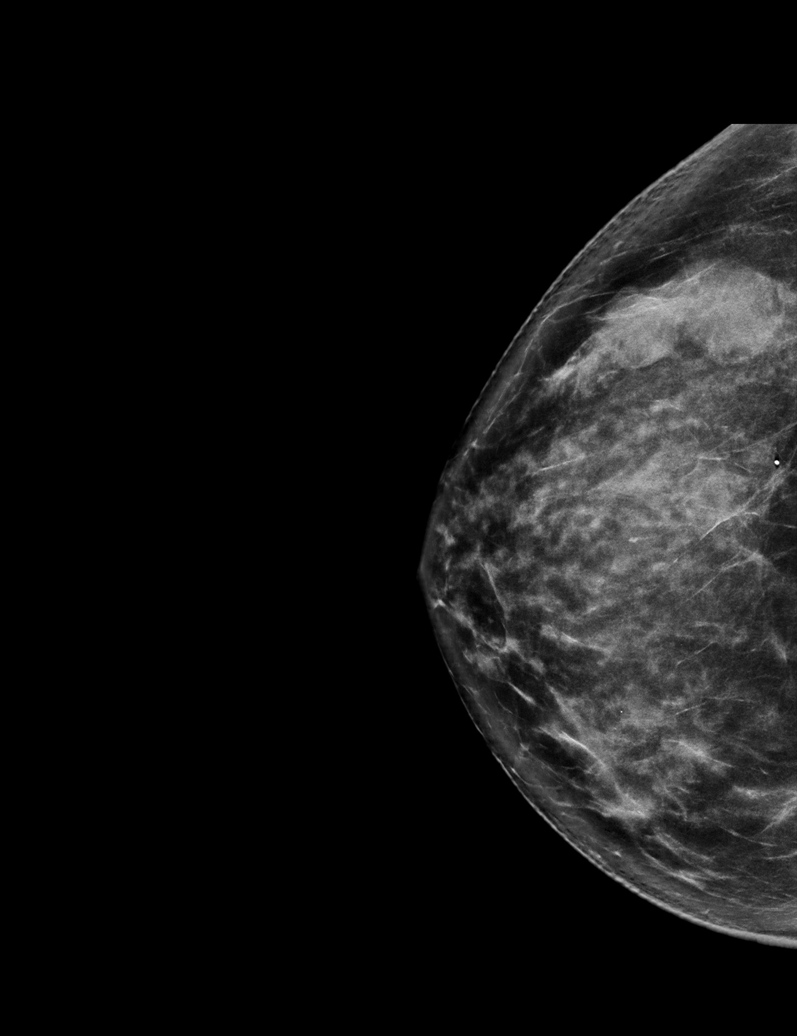

[L MLO synth-2D]
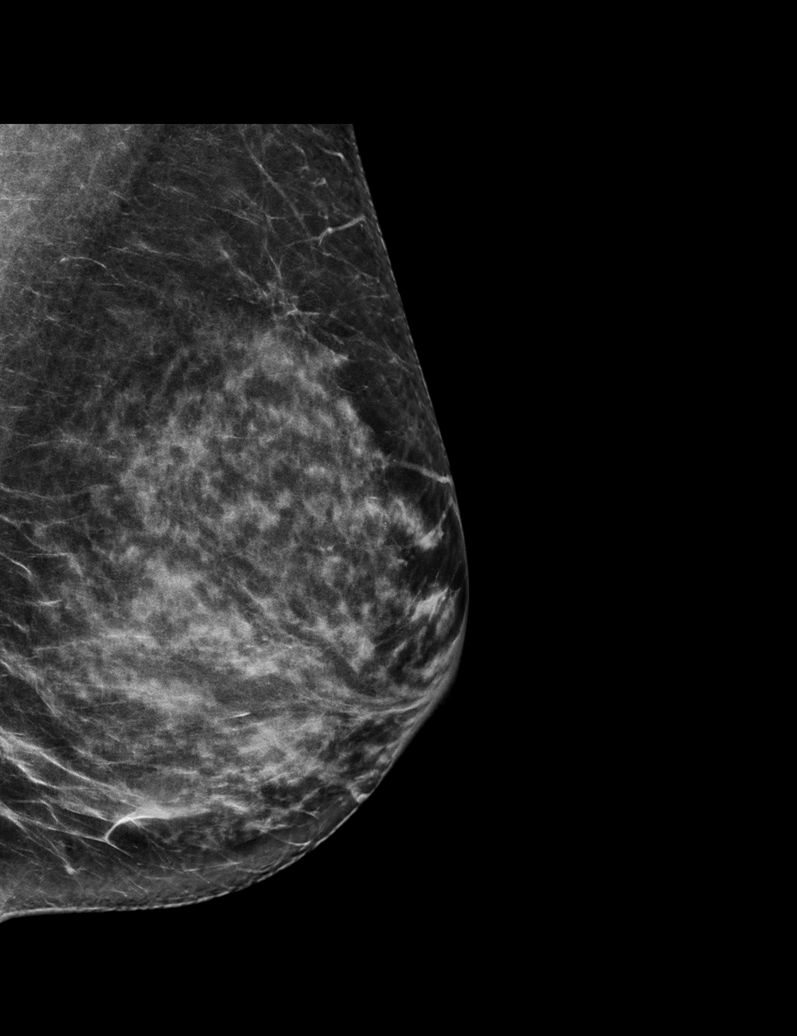

[L CC tomo · tomo slice 36/71.0]
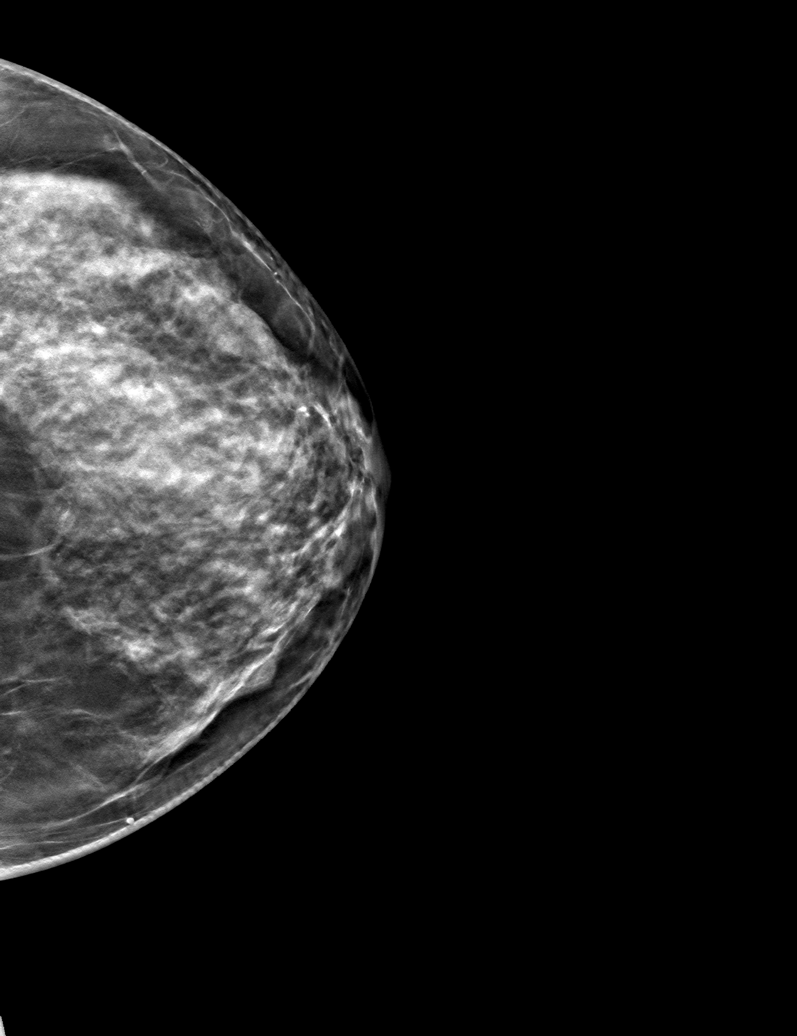

[6 of 30 positions shown; findings below may reference images not displayed]

ACR Breast Density Category c: The breast tissue is heterogeneously
dense, which may obscure small masses.
FINDINGS: There are no findings suspicious for malignancy. Images were
processed with CAD.
IMPRESSION: No mammographic evidence of malignancy. A result letter of this
screening mammogram will be mailed directly to the patient.

RECOMMENDATION:
Screening mammogram in one year. (Code:[id number])

BI-RADS CATEGORY  1: Negative.

## 2022-02-23 ENCOUNTER — Encounter (INDEPENDENT_AMBULATORY_CARE_PROVIDER_SITE_OTHER): Payer: Self-pay | Admitting: Vascular Surgery

## 2022-02-23 ENCOUNTER — Ambulatory Visit (INDEPENDENT_AMBULATORY_CARE_PROVIDER_SITE_OTHER): Payer: BC Managed Care – PPO | Admitting: Vascular Surgery

## 2022-02-23 VITALS — BP 127/77 | HR 69 | Resp 16 | Ht 66.0 in | Wt 135.0 lb

## 2022-02-23 DIAGNOSIS — I83813 Varicose veins of bilateral lower extremities with pain: Secondary | ICD-10-CM

## 2022-02-23 DIAGNOSIS — I480 Paroxysmal atrial fibrillation: Secondary | ICD-10-CM | POA: Diagnosis not present

## 2022-02-23 NOTE — Assessment & Plan Note (Signed)
On anticoagulation 

## 2022-02-23 NOTE — Progress Notes (Signed)
Patient ID: Donna Reid, female   DOB: [birth year], 56 y.o.   MRN: [id number]  Chief Complaint  Patient presents with   Establish Care    Referred by Dr Theda Sers    HPI Donna Reid is a 56 y.o. female.  I am asked to see the patient by Suzzanne Cloud for evaluation of symptomatic varicose veins.  The patient presents with complaints of symptomatic varicosities of the lower extremities. The patient reports a long standing history of varicosities and they have become painful over time.  She underwent treatment for her varicose veins about 12 to 15 years ago with laser ablation at our old office.  There was no clear inciting event or causative factor that started the symptoms.  The left leg is more severly affected. The patient elevates the legs for relief. The pain is described as stinging, burning, and heaviness in her legs. The symptoms are generally most severe in the evening, particularly when they have been on their feet for long periods of time.  Since her laser ablation over a decade ago, she has regularly worn compression socks but she is still having pain and swelling.  Elevation has been used to try to improve the symptoms with limited success. The patient complains of intermittent swelling as an associated symptom. The patient has no previous history of deep venous thrombosis or superficial thrombophlebitis to their knowledge.     Past Medical History:  Diagnosis Date   Abnormal Pap smear of cervix 1998   CIN 1   Basal cell carcinoma 2012   Lyme disease    Migraine    Palpitations    Paroxysmal atrial fibrillation ([place]) 2019   Paroxysmal sinus tachycardia (HCC)    Retroperitoneal bleed 09/20/2018   after cardiac ablation-bleed from right groin. Received one unit of PRBCs    Past Surgical History:  Procedure Laterality Date   ATRIAL FIBRILLATION ABLATION  09/20/2018   BASAL CELL CARCINOMA EXCISION  08/2012   Moh's surgery on bridge of nose and plastic surgery   CARPAL  TUNNEL RELEASE Right 2008   CARPAL TUNNEL RELEASE Left 09/08/2011   CESAREAN SECTION  2000, 2006   COLONOSCOPY  2002   Dr. Vira Agar internal hemorrhoids   HAND SURGERY Right 07/28/2011   cyst removed   LASER ABLATION  2010   Dr Shuna Tabor-varicose vein   LEFT HEART CATH AND CORONARY ANGIOGRAPHY Left 04/25/2018   Procedure: LEFT HEART CATH AND CORONARY ANGIOGRAPHY;  Surgeon: Yolonda Kida, MD;  Location: [facility];  Service: Cardiovascular;  Laterality: Left;    Family History  Problem Relation Age of Onset   Diabetes Maternal Grandmother    Coronary artery disease Maternal Grandmother        Had MI at age 59   Heart attack Maternal Grandfather    Diabetes Paternal Grandmother    Hyperlipidemia Mother    Hypertension Mother    Hypertension Father    Sudden Cardiac Death Cousin 2   Breast cancer Neg Hx       Social History   Tobacco Use   Smoking status: Former   Smokeless tobacco: Never  Scientific laboratory technician Use: Never used  Substance Use Topics   Alcohol use: Yes    Comment: occ   Drug use: No     Allergies  Allergen Reactions   Rivaroxaban Hives and Rash   Andexanet Alfa Hives    Xarelto,Apixaban and Edoxaban   Latex Rash and Itching  Current Outpatient Medications  Medication Sig Dispense Refill   ALPRAZolam (XANAX) 0.25 MG tablet alprazolam 0.25 mg tablet     clotrimazole-betamethasone (LOTRISONE) cream Apply 1 application topically 2 (two) times daily. (Patient taking differently: Apply 1 application. topically 2 (two) times daily as needed (yeast).) 30 g 0   dabigatran (PRADAXA) 150 MG CAPS capsule Pradaxa 150 mg capsule     flecainide (TAMBOCOR) 100 MG tablet      ipratropium (ATROVENT) 0.03 % nasal spray ipratropium bromide 0.03 % nasal spray     SUMAtriptan (IMITREX) 50 MG tablet sumatriptan 50 mg tablet 10 tablet 3   terconazole (TERAZOL 3) 0.8 % vaginal cream Place 1 applicator vaginally at bedtime. Can apply externally BID if needed for  itching 20 g 0   valACYclovir (VALTREX) 1000 MG tablet Take 2000 mgm BID x 2 doses prn fever blisters. 30 tablet 2   No current facility-administered medications for this visit.      REVIEW OF SYSTEMS (Negative unless checked)  Constitutional: '[]'$ Weight loss  '[]'$ Fever  '[]'$ Chills Cardiac: '[]'$ Chest pain   '[]'$ Chest pressure   '[x]'$ Palpitations   '[]'$ Shortness of breath when laying flat   '[]'$ Shortness of breath at rest   '[]'$ Shortness of breath with exertion. Vascular:  '[]'$ Pain in legs with walking   '[]'$ Pain in legs at rest   '[]'$ Pain in legs when laying flat   '[]'$ Claudication   '[]'$ Pain in feet when walking  '[]'$ Pain in feet at rest  '[]'$ Pain in feet when laying flat   '[]'$ History of DVT   '[]'$ Phlebitis   '[x]'$ Swelling in legs   '[x]'$ Varicose veins   '[]'$ Non-healing ulcers Pulmonary:   '[]'$ Uses home oxygen   '[]'$ Productive cough   '[]'$ Hemoptysis   '[]'$ Wheeze  '[]'$ COPD   '[]'$ Asthma Neurologic:  '[]'$ Dizziness  '[]'$ Blackouts   '[]'$ Seizures   '[]'$ History of stroke   '[]'$ History of TIA  '[]'$ Aphasia   '[]'$ Temporary blindness   '[]'$ Dysphagia   '[]'$ Weakness or numbness in arms   '[]'$ Weakness or numbness in legs Musculoskeletal:  '[]'$ Arthritis   '[]'$ Joint swelling   '[]'$ Joint pain   '[]'$ Low back pain Hematologic:  '[]'$ Easy bruising  '[]'$ Easy bleeding   '[]'$ Hypercoagulable state   '[]'$ Anemic  '[]'$ Hepatitis Gastrointestinal:  '[]'$ Blood in stool   '[]'$ Vomiting blood  '[]'$ Gastroesophageal reflux/heartburn   '[]'$ Abdominal pain Genitourinary:  '[]'$ Chronic kidney disease   '[]'$ Difficult urination  '[]'$ Frequent urination  '[]'$ Burning with urination   '[]'$ Hematuria Skin:  '[]'$ Rashes   '[]'$ Ulcers   '[]'$ Wounds Psychological:  '[]'$ History of anxiety   '[]'$  History of major depression.    Physical Exam BP 127/77 (BP Location: Left Arm)   Pulse 69   Resp 16   Ht '5\' 6"'$  (1.676 m)   Wt 135 lb (61.2 kg)   BMI 21.79 kg/m  Gen:  WD/WN, NAD.  Appears younger than stated age Head: [place]/AT, No temporalis wasting.  Ear/Nose/Throat: Hearing grossly intact, dentition good Eyes: Sclera non-icteric. Conjunctiva clear Neck: Supple.  Trachea midline Pulmonary:  Good air movement, no use of accessory muscles, respirations not labored.  Cardiac: RRR, No JVD Vascular: Varicosities diffuse and measuring up to 2 mm in the right lower extremity        Varicosities fairly extensive and measuring up to 2-3 mm in the left lower extremity Vessel Right Left  Radial Palpable Palpable                          PT Palpable Palpable  DP Palpable Palpable   Gastrointestinal: soft, non-tender/non-distended.  Musculoskeletal: M/S 5/5  throughout.   Trace bilateral lower extremity LE edema Neurologic: Sensation grossly intact in extremities.  Symmetrical.  Speech is fluent.  Psychiatric: Judgment intact, Mood & affect appropriate for pt's clinical situation. Dermatologic: No rashes or ulcers noted.  No cellulitis or open wounds.    Radiology No results found.  Labs No results found for this or any previous visit (from the past 2160 hour(s)).  Assessment/Plan:  Paroxysmal atrial fibrillation (HCC) On anticoagulation  Varicose veins of leg with pain, bilateral   The patient has symptoms consistent with chronic venous insufficiency. We discussed the natural history and treatment options for venous disease. I recommended the regular use of 20 - 30 mm Hg compression stockings, and she has been doing that for years. I recommended leg elevation and anti-inflammatories as needed for pain. I have also recommended a complete venous duplex to assess the venous system for reflux or thrombotic issues. This can be done at the patient's convenience. I will see the patient back after her duplex to assess the response to conservative management, and determine further treatment options.     Leotis Pain 02/23/2022, 1:59 PM   This note was created with Dragon medical transcription system.  Any errors from dictation are unintentional.

## 2022-03-05 ENCOUNTER — Ambulatory Visit (INDEPENDENT_AMBULATORY_CARE_PROVIDER_SITE_OTHER): Payer: BC Managed Care – PPO

## 2022-03-05 ENCOUNTER — Encounter (INDEPENDENT_AMBULATORY_CARE_PROVIDER_SITE_OTHER): Payer: Self-pay | Admitting: Nurse Practitioner

## 2022-03-05 ENCOUNTER — Ambulatory Visit (INDEPENDENT_AMBULATORY_CARE_PROVIDER_SITE_OTHER): Payer: BC Managed Care – PPO | Admitting: Nurse Practitioner

## 2022-03-05 DIAGNOSIS — I83813 Varicose veins of bilateral lower extremities with pain: Secondary | ICD-10-CM

## 2022-03-09 NOTE — Progress Notes (Signed)
Subjective:    Patient ID: Donna Reid, female    DOB: 1966-02-07, 56 y.o.   MRN: [id number] Chief Complaint  Patient presents with   Follow-up    Ultrasound follow up    Donna Reid is a 56 year old female that presents today for reevaluation of pain and discomfort in her bilateral lower extremities.The patient presents with complaints of symptomatic varicosities of the lower extremities. The patient reports a long standing history of varicosities and they have become painful over time.  She underwent treatment for her varicose veins about 12 to 15 years ago with laser ablation at our old office.  There was no clear inciting event or causative factor that started the symptoms.  The left leg is more severly affected, but symptoms have recently begun on the right as well.  The patient elevates the legs for relief. The pain is described as stinging, burning, and heaviness in her legs. The symptoms are generally most severe in the evening, particularly when they have been on their feet for long periods of time.  Since her laser ablation over a decade ago, she has regularly worn compression socks but she is still having pain and swelling.  Elevation has been used to try to improve the symptoms with limited success. The patient complains of intermittent swelling as an associated symptom. The patient has no previous history of deep venous thrombosis or superficial thrombophlebitis to their knowledge.  Today noninvasive studies show evidence of reflux in the right great saphenous vein.  The patient has evidence of left great saphenous vein ablation however has a large superficial varicosity which appears to be an anterior accessory saphenous vein.  No evidence of DVT or superficial thrombophlebitis noted bilaterally.   Review of Systems  Cardiovascular:  Positive for leg swelling.  All other systems reviewed and are negative.     Objective:   Physical Exam Vitals reviewed.  HENT:     Head:  Normocephalic.  Cardiovascular:     Rate and Rhythm: Normal rate.     Pulses: Normal pulses.  Pulmonary:     Effort: Pulmonary effort is normal.  Skin:    General: Skin is warm and dry.     Comments: Notable varicosities bilaterally  Neurological:     Mental Status: She is alert and oriented to person, place, and time.  Psychiatric:        Mood and Affect: Mood normal.        Behavior: Behavior normal.        Thought Content: Thought content normal.        Judgment: Judgment normal.    BP 122/78 (BP Location: Left Arm)   Pulse 72   Resp 16   Wt 138 lb 6.4 oz (62.8 kg)   BMI 22.34 kg/m   Past Medical History:  Diagnosis Date   Abnormal Pap smear of cervix 1998   CIN 1   Basal cell carcinoma 2012   Lyme disease    Migraine    Palpitations    Paroxysmal atrial fibrillation ([place]) 2019   Paroxysmal sinus tachycardia (HCC)    Retroperitoneal bleed 09/20/2018   after cardiac ablation-bleed from right groin. Received one unit of PRBCs    Social History   Socioeconomic History   Marital status: Married    Spouse name: Not on file   Number of children: 2   Years of education: Not on file   Highest education level: Not on file  Occupational History   Occupation: Pharmacist, hospital  Tobacco Use   Smoking status: Former   Smokeless tobacco: Never  Vaping Use   Vaping Use: Never used  Substance and Sexual Activity   Alcohol use: Yes    Comment: occ   Drug use: No   Sexual activity: Yes    Partners: Male    Birth control/protection: Surgical  Other Topics Concern   Not on file  Social History Narrative   Not on file   Social Determinants of Health   Financial Resource Strain: Not on file  Food Insecurity: Not on file  Transportation Needs: Not on file  Physical Activity: Not on file  Stress: Not on file  Social Connections: Not on file  Intimate Partner Violence: Not on file    Past Surgical History:  Procedure Laterality Date   ATRIAL FIBRILLATION ABLATION   09/20/2018   BASAL CELL CARCINOMA EXCISION  08/2012   Moh's surgery on bridge of nose and plastic surgery   CARPAL TUNNEL RELEASE Right 2008   CARPAL TUNNEL RELEASE Left 09/08/2011   CESAREAN SECTION  2000, 2006   COLONOSCOPY  2002   Dr. Vira Agar internal hemorrhoids   HAND SURGERY Right 07/28/2011   cyst removed   LASER ABLATION  2010   Dr Dew-varicose vein   LEFT HEART CATH AND CORONARY ANGIOGRAPHY Left 04/25/2018   Procedure: LEFT HEART CATH AND CORONARY ANGIOGRAPHY;  Surgeon: Yolonda Kida, MD;  Location: [facility];  Service: Cardiovascular;  Laterality: Left;    Family History  Problem Relation Age of Onset   Diabetes Maternal Grandmother    Coronary artery disease Maternal Grandmother        Had MI at age 70   Heart attack Maternal Grandfather    Diabetes Paternal Grandmother    Hyperlipidemia Mother    Hypertension Mother    Hypertension Father    Sudden Cardiac Death Cousin 63   Breast cancer Neg Hx     Allergies  Allergen Reactions   Rivaroxaban Hives and Rash   Andexanet Alfa Hives    Xarelto,Apixaban and Edoxaban   Latex Rash and Itching       Latest Ref Rng & Units 02/16/2018    3:55 AM 02/15/2018    6:37 PM  CBC  WBC 3.6 - 11.0 K/uL 4.3   5.4    Hemoglobin 12.0 - 16.0 g/dL 13.0   15.0    Hematocrit 35.0 - 47.0 % 37.1   43.3    Platelets 150 - 440 K/uL 230   285        CMP     Component Value Date/Time   NA 138 02/16/2018 0355   K 3.7 02/16/2018 0355   CL 109 02/16/2018 0355   CO2 22 02/16/2018 0355   GLUCOSE 99 02/16/2018 0355   BUN 11 02/16/2018 0355   CREATININE 0.81 02/16/2018 0355   CALCIUM 8.3 (L) 02/16/2018 0355   GFRNONAA >60 02/16/2018 0355   GFRAA >60 02/16/2018 0355     No results found.     Assessment & Plan:   1. Varicose veins of leg with pain, bilateral Recommend  I have reviewed my previous  discussion with the patient regarding  varicose veins and why they cause symptoms. Patient will continue  wearing  graduated compression stockings class 1 on a daily basis, beginning first thing in the morning and removing them in the evening.    In addition, behavioral modification including elevation during the day was again discussed and this will continue.  The  patient has utilized over the counter pain medications and has been exercising.  However, at this time conservative therapy has not alleviated the patient's symptoms of leg pain and swelling  Recommend: laser ablation of the right  great saphenous veins to eliminate the symptoms of pain and swelling of the lower extremities caused by the severe superficial venous reflux disease.    The patient also has evidence of significant venous reflux in her left lower extremity and an accessory saphenous vein.  This vein is too small and tortuous for an endovenous laser ablation therefore the patient is recommended to undergo foam sclerotherapy to treat the symptoms of pain caused by severe superficial venous reflux. - VAS [place] LOWER EXTREMITY VENOUS REFLUX   Current Outpatient Medications on File Prior to Visit  Medication Sig Dispense Refill   ALPRAZolam (XANAX) 0.25 MG tablet alprazolam 0.25 mg tablet     clotrimazole-betamethasone (LOTRISONE) cream Apply 1 application topically 2 (two) times daily. (Patient taking differently: Apply 1 application. topically 2 (two) times daily as needed (yeast).) 30 g 0   dabigatran (PRADAXA) 150 MG CAPS capsule Pradaxa 150 mg capsule     flecainide (TAMBOCOR) 100 MG tablet      ipratropium (ATROVENT) 0.03 % nasal spray ipratropium bromide 0.03 % nasal spray     SUMAtriptan (IMITREX) 50 MG tablet sumatriptan 50 mg tablet 10 tablet 3   terconazole (TERAZOL 3) 0.8 % vaginal cream Place 1 applicator vaginally at bedtime. Can apply externally BID if needed for itching 20 g 0   valACYclovir (VALTREX) 1000 MG tablet Take 2000 mgm BID x 2 doses prn fever blisters. 30 tablet 2   No current facility-administered medications on  file prior to visit.    There are no Patient Instructions on file for this visit. No follow-ups on file.   Kris Hartmann, NP

## 2022-03-10 ENCOUNTER — Encounter (INDEPENDENT_AMBULATORY_CARE_PROVIDER_SITE_OTHER): Payer: Self-pay | Admitting: Nurse Practitioner

## 2022-04-21 ENCOUNTER — Telehealth (INDEPENDENT_AMBULATORY_CARE_PROVIDER_SITE_OTHER): Payer: Self-pay | Admitting: Nurse Practitioner

## 2022-04-21 NOTE — Telephone Encounter (Signed)
Patient called in requesting information about insurance approval or denial about vein surgery. Let patient know person that does that is out for the rest of the day and would leave a message so she can get back to her tomorrow. Patient was ok with that       Please call and advise

## 2022-04-22 NOTE — Telephone Encounter (Signed)
LVM for pt TCB and schedule appt.  I went ahead and made appt (1st available) but we can change if need be. We also need to make 1 week [place] and 4 wk post lasr. After that, we need to make left leg FOAM sclero.

## 2022-04-30 ENCOUNTER — Telehealth (INDEPENDENT_AMBULATORY_CARE_PROVIDER_SITE_OTHER): Payer: Self-pay | Admitting: Vascular Surgery

## 2022-04-30 NOTE — Telephone Encounter (Signed)
Pt called in regarding laser and FOAM sclero appts. She wanted to confirm appt and ask several questions which all were answered. Pt states that she may call back and decide to do bith legs the same day. Right leg laser already scheduled. Pt may want to go ahead and schedule the FOAM sclero on the left leg. Per Dr. Lucky Cowboy, he can do both procedures the same day. I did contact [facility] and received authorization that if pt decides to do both the same day, it will be covered. Call ref # [id number].

## 2022-06-01 ENCOUNTER — Other Ambulatory Visit (INDEPENDENT_AMBULATORY_CARE_PROVIDER_SITE_OTHER): Payer: BC Managed Care – PPO | Admitting: Vascular Surgery

## 2022-06-11 ENCOUNTER — Encounter (INDEPENDENT_AMBULATORY_CARE_PROVIDER_SITE_OTHER): Payer: BC Managed Care – PPO

## 2022-07-02 ENCOUNTER — Ambulatory Visit (INDEPENDENT_AMBULATORY_CARE_PROVIDER_SITE_OTHER): Payer: BC Managed Care – PPO | Admitting: Nurse Practitioner

## 2022-07-09 ENCOUNTER — Other Ambulatory Visit (INDEPENDENT_AMBULATORY_CARE_PROVIDER_SITE_OTHER): Payer: BC Managed Care – PPO | Admitting: Vascular Surgery

## 2022-07-16 ENCOUNTER — Encounter (INDEPENDENT_AMBULATORY_CARE_PROVIDER_SITE_OTHER): Payer: BC Managed Care – PPO

## 2022-08-02 ENCOUNTER — Encounter (INDEPENDENT_AMBULATORY_CARE_PROVIDER_SITE_OTHER): Payer: Self-pay

## 2022-08-13 ENCOUNTER — Ambulatory Visit (INDEPENDENT_AMBULATORY_CARE_PROVIDER_SITE_OTHER): Payer: BC Managed Care – PPO | Admitting: Nurse Practitioner

## 2022-08-13 ENCOUNTER — Ambulatory Visit (INDEPENDENT_AMBULATORY_CARE_PROVIDER_SITE_OTHER): Payer: BC Managed Care – PPO | Admitting: Vascular Surgery

## 2022-08-18 ENCOUNTER — Ambulatory Visit: Payer: BC Managed Care – PPO | Admitting: Dermatology

## 2022-08-18 DIAGNOSIS — L719 Rosacea, unspecified: Secondary | ICD-10-CM

## 2022-08-18 DIAGNOSIS — Z85828 Personal history of other malignant neoplasm of skin: Secondary | ICD-10-CM | POA: Diagnosis not present

## 2022-08-18 DIAGNOSIS — L814 Other melanin hyperpigmentation: Secondary | ICD-10-CM

## 2022-08-18 DIAGNOSIS — B360 Pityriasis versicolor: Secondary | ICD-10-CM

## 2022-08-18 DIAGNOSIS — L570 Actinic keratosis: Secondary | ICD-10-CM

## 2022-08-18 DIAGNOSIS — D229 Melanocytic nevi, unspecified: Secondary | ICD-10-CM

## 2022-08-18 DIAGNOSIS — L821 Other seborrheic keratosis: Secondary | ICD-10-CM

## 2022-08-18 DIAGNOSIS — Z1283 Encounter for screening for malignant neoplasm of skin: Secondary | ICD-10-CM | POA: Diagnosis not present

## 2022-08-18 DIAGNOSIS — I8393 Asymptomatic varicose veins of bilateral lower extremities: Secondary | ICD-10-CM

## 2022-08-18 DIAGNOSIS — L578 Other skin changes due to chronic exposure to nonionizing radiation: Secondary | ICD-10-CM

## 2022-08-18 DIAGNOSIS — L820 Inflamed seborrheic keratosis: Secondary | ICD-10-CM

## 2022-08-18 MED ORDER — KETOCONAZOLE 2 % EX SHAM: MEDICATED_SHAMPOO | CUTANEOUS | 5 refills | Status: AC

## 2022-08-18 NOTE — Patient Instructions (Signed)
Due to recent changes in healthcare laws, you may see results of your pathology and/or laboratory studies on MyChart before the doctors have had a chance to review them. We understand that in some cases there may be results that are confusing or concerning to you. Please understand that not all results are received at the same time and often the doctors may need to interpret multiple results in order to provide you with the best plan of care or course of treatment. Therefore, we ask that you please give us 2 business days to thoroughly review all your results before contacting the office for clarification. Should we see a critical lab result, you will be contacted sooner.   If You Need Anything After Your Visit  If you have any questions or concerns for your doctor, please call our main line at [phone] and press option 4 to reach your doctor's medical assistant. If no one answers, please leave a voicemail as directed and we will return your call as soon as possible. Messages left after 4 pm will be answered the following business day.   You may also send us a message via MyChart. We typically respond to MyChart messages within 1-2 business days.  For prescription refills, please ask your pharmacy to contact our office. Our fax number is [phone].  If you have an urgent issue when the clinic is closed that cannot wait until the next business day, you can page your doctor at the number below.    Please note that while we do our best to be available for urgent issues outside of office hours, we are not available 24/7.   If you have an urgent issue and are unable to reach us, you may choose to seek medical care at your doctor's office, retail clinic, urgent care center, or emergency room.  If you have a medical emergency, please immediately call 911 or go to the emergency department.  Pager Numbers  - Dr. Kowalski: [phone]  - Dr. Moye: [phone]  - Dr. Stewart:  [phone]  In the event of inclement weather, please call our main line at [phone] for an update on the status of any delays or closures.  Dermatology Medication Tips: Please keep the boxes that topical medications come in in order to help keep track of the instructions about where and how to use these. Pharmacies typically print the medication instructions only on the boxes and not directly on the medication tubes.   If your medication is too expensive, please contact our office at [phone] option 4 or send us a message through MyChart.   We are unable to tell what your co-pay for medications will be in advance as this is different depending on your insurance coverage. However, we may be able to find a substitute medication at lower cost or fill out paperwork to get insurance to cover a needed medication.   If a prior authorization is required to get your medication covered by your insurance company, please allow us 1-2 business days to complete this process.  Drug prices often vary depending on where the prescription is filled and some pharmacies may offer cheaper prices.  The website www.goodrx.com contains coupons for medications through different pharmacies. The prices here do not account for what the cost may be with help from insurance (it may be cheaper with your insurance), but the website can give you the price if you did not use any insurance.  - You can print the associated coupon and take it with   your prescription to the pharmacy.  - You may also stop by our office during regular business hours and pick up a GoodRx coupon card.  - If you need your prescription sent electronically to a different pharmacy, notify our office through [facility] MyChart or by phone at [phone] option 4.     Si Usted Necesita Algo Despus de Su Visita  Tambin puede enviarnos un mensaje a travs de MyChart. Por lo general respondemos a los mensajes de MyChart en el transcurso de 1 a 2  das hbiles.  Para renovar recetas, por favor pida a su farmacia que se ponga en contacto con nuestra oficina. Nuestro nmero de fax es el [phone].  Si tiene un asunto urgente cuando la clnica est cerrada y que no puede esperar hasta el siguiente da hbil, puede llamar/localizar a su doctor(a) al nmero que aparece a continuacin.   Por favor, tenga en cuenta que aunque hacemos todo lo posible para estar disponibles para asuntos urgentes fuera del horario de oficina, no estamos disponibles las 24 horas del da, los 7 das de la semana.   Si tiene un problema urgente y no puede comunicarse con nosotros, puede optar por buscar atencin mdica  en el consultorio de su doctor(a), en una clnica privada, en un centro de atencin urgente o en una sala de emergencias.  Si tiene una emergencia mdica, por favor llame inmediatamente al 911 o vaya a la sala de emergencias.  Nmeros de bper  - Dr. Kowalski: [phone]  - Dra. Moye: [phone]  - Dra. Stewart: [phone]  En caso de inclemencias del tiempo, por favor llame a nuestra lnea principal al [phone] para una actualizacin sobre el estado de cualquier retraso o cierre.  Consejos para la medicacin en dermatologa: Por favor, guarde las cajas en las que vienen los medicamentos de uso tpico para ayudarle a seguir las instrucciones sobre dnde y cmo usarlos. Las farmacias generalmente imprimen las instrucciones del medicamento slo en las cajas y no directamente en los tubos del medicamento.   Si su medicamento es muy caro, por favor, pngase en contacto con nuestra oficina llamando al [phone] y presione la opcin 4 o envenos un mensaje a travs de MyChart.   No podemos decirle cul ser su copago por los medicamentos por adelantado ya que esto es diferente dependiendo de la cobertura de su seguro. Sin embargo, es posible que podamos encontrar un medicamento sustituto a menor costo o llenar un formulario para que el  seguro cubra el medicamento que se considera necesario.   Si se requiere una autorizacin previa para que su compaa de seguros cubra su medicamento, por favor permtanos de 1 a 2 das hbiles para completar este proceso.  Los precios de los medicamentos varan con frecuencia dependiendo del lugar de dnde se surte la receta y alguna farmacias pueden ofrecer precios ms baratos.  El sitio web www.goodrx.com tiene cupones para medicamentos de diferentes farmacias. Los precios aqu no tienen en cuenta lo que podra costar con la ayuda del seguro (puede ser ms barato con su seguro), pero el sitio web puede darle el precio si no utiliz ningn seguro.  - Puede imprimir el cupn correspondiente y llevarlo con su receta a la farmacia.  - Tambin puede pasar por nuestra oficina durante el horario de atencin regular y recoger una tarjeta de cupones de GoodRx.  - Si necesita que su receta se enve electrnicamente a una farmacia diferente, informe a nuestra oficina a travs de MyChart de [place]   o por telfono llamando al [phone] y presione la opcin 4.  

## 2022-08-18 NOTE — Progress Notes (Signed)
New Patient Visit  Subjective  Donna Reid is a 56 y.o. female who presents for the following: Annual Exam (Hx BCC ). The patient presents for Total-Body Skin Exam (TBSE) for skin cancer screening and mole check.  The patient has spots, moles and lesions to be evaluated, some may be new or changing and the patient has concerns that these could be cancer.  The following portions of the chart were reviewed this encounter and updated as appropriate:   Tobacco  Allergies  Meds  Problems  Med Hx  Surg Hx  Fam Hx     Review of Systems:  No other skin or systemic complaints except as noted in HPI or Assessment and Plan.  Objective  Well appearing patient in no apparent distress; mood and affect are within normal limits.  A full examination was performed including scalp, head, eyes, ears, nose, lips, neck, chest, axillae, abdomen, back, buttocks, bilateral upper extremities, bilateral lower extremities, hands, feet, fingers, toes, fingernails, and toenails. All findings within normal limits unless otherwise noted below.  Face Mid face erythema with telangiectasias +/- scattered inflammatory papules.   Nose x 1 Erythematous thin papules/macules with gritty scale.   Left Medial Thigh x 1 Erythematous stuck-on, waxy papule or plaque   Assessment & Plan  Rosacea Face Rosacea is a chronic progressive skin condition usually affecting the face of adults, causing redness and/or acne bumps. It is treatable but not curable. It sometimes affects the eyes (ocular rosacea) as well. It may respond to topical and/or systemic medication and can flare with stress, sun exposure, alcohol, exercise, topical steroids (including hydrocortisone/cortisone 10) and some foods.  Daily application of broad spectrum spf 30+ sunscreen to face is recommended to reduce flares.  AK (actinic keratosis) Nose x 1 Destruction of lesion - Nose x 1 Complexity: simple   Destruction method: cryotherapy   Informed  consent: discussed and consent obtained   Timeout:  patient name, date of birth, surgical site, and procedure verified Lesion destroyed using liquid nitrogen: Yes   Region frozen until ice ball extended beyond lesion: Yes   Outcome: patient tolerated procedure well with no complications   Post-procedure details: wound care instructions given    Tinea versicolor Back Tinea versicolor is a chronic recurrent skin rash causing discolored scaly spots most commonly seen on back, chest, and/or shoulders.  It is generally asymptomatic. The rash is due to overgrowth of a common type of yeast present on everyone's skin and it is not contagious.  It tends to flare more in the summer due to increased sweating on trunk.  After rash is treated, the scaliness will resolve, but the discoloration will take longer to return to normal pigmentation. The periodic use of an OTC medicated soap/shampoo with zinc or selenium sulfide can be helpful to prevent yeast overgrowth and recurrence.  Start Ketoconazole 2% shampoo from the waist up let sit 5-10 minutes then wash off. Use 2-3 days per week.  ketoconazole (NIZORAL) 2 % shampoo - Back Shampoo from the waist up let sit 5-10 minutes then wash off. Use 2-3 days per week for 6 weeks. Then QM thereafter.  Inflamed seborrheic keratosis Left Medial Thigh x 1 Patient deferred treatment at this time.  Lentigines - Scattered tan macules - Due to sun exposure - Benign-appearing, observe - Recommend daily broad spectrum sunscreen SPF 30+ to sun-exposed areas, reapply every 2 hours as needed. - Call for any changes  Seborrheic Keratoses - Stuck-on, waxy, tan-brown papules and/or plaques  -  Benign-appearing - Discussed benign etiology and prognosis. - Observe - Call for any changes  Melanocytic Nevi - Tan-brown and/or pink-flesh-colored symmetric macules and papules - Benign appearing on exam today - Observation - Call clinic for new or changing moles -  Recommend daily use of broad spectrum spf 30+ sunscreen to sun-exposed areas.   Hemangiomas - Red papules - Discussed benign nature - Observe - Call for any changes  Actinic Damage - Chronic condition, secondary to cumulative UV/sun exposure - diffuse scaly erythematous macules with underlying dyspigmentation - Recommend daily broad spectrum sunscreen SPF 30+ to sun-exposed areas, reapply every 2 hours as needed.  - Staying in the shade or wearing long sleeves, sun glasses (UVA+UVB protection) and wide brim hats (4-inch brim around the entire circumference of the hat) are also recommended for sun protection.  - Call for new or changing lesions.  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  - Call if any new or changing lesions are noted between office visits  Varicose Veins/Spider Veins - Dilated blue, purple or red veins at the lower extremities - Reassured - Smaller vessels can be treated by sclerotherapy (a procedure to inject a medicine into the veins to make them disappear) if desired, but the treatment is not covered by insurance. Larger vessels may be covered if symptomatic and we would refer to vascular surgeon if treatment desired.  Skin cancer screening performed today.  Return in about 6 months (around 02/16/2023) for Ak follow up .  Luther Redo, CMA, am acting as scribe for Sarina Ser, MD . Documentation: I have reviewed the above documentation for accuracy and completeness, and I agree with the above.  Sarina Ser, MD

## 2022-09-02 ENCOUNTER — Encounter: Payer: Self-pay | Admitting: Dermatology

## 2022-09-17 ENCOUNTER — Ambulatory Visit (INDEPENDENT_AMBULATORY_CARE_PROVIDER_SITE_OTHER): Payer: BC Managed Care – PPO | Admitting: Vascular Surgery

## 2022-11-23 ENCOUNTER — Other Ambulatory Visit: Payer: Self-pay | Admitting: Certified Nurse Midwife

## 2022-11-23 DIAGNOSIS — Z1231 Encounter for screening mammogram for malignant neoplasm of breast: Secondary | ICD-10-CM

## 2022-12-09 ENCOUNTER — Ambulatory Visit
Admission: RE | Admit: 2022-12-09 | Discharge: 2022-12-09 | Disposition: A | Discharge status: Home / Self Care | Payer: BC Managed Care – PPO | Source: Ambulatory Visit | Attending: Certified Nurse Midwife | Admitting: Certified Nurse Midwife

## 2022-12-09 DIAGNOSIS — Z1231 Encounter for screening mammogram for malignant neoplasm of breast: Secondary | ICD-10-CM | POA: Insufficient documentation

## 2023-02-17 ENCOUNTER — Ambulatory Visit: Payer: BC Managed Care – PPO | Admitting: Dermatology

## 2023-05-11 ENCOUNTER — Encounter: Payer: Self-pay | Admitting: Dermatology

## 2023-05-11 ENCOUNTER — Ambulatory Visit: Payer: BC Managed Care – PPO | Admitting: Dermatology

## 2023-05-11 DIAGNOSIS — L578 Other skin changes due to chronic exposure to nonionizing radiation: Secondary | ICD-10-CM | POA: Diagnosis not present

## 2023-05-11 DIAGNOSIS — Z5111 Encounter for antineoplastic chemotherapy: Secondary | ICD-10-CM

## 2023-05-11 DIAGNOSIS — W908XXA Exposure to other nonionizing radiation, initial encounter: Secondary | ICD-10-CM

## 2023-05-11 DIAGNOSIS — Z7189 Other specified counseling: Secondary | ICD-10-CM

## 2023-05-11 DIAGNOSIS — L570 Actinic keratosis: Secondary | ICD-10-CM | POA: Diagnosis not present

## 2023-05-11 DIAGNOSIS — Z79899 Other long term (current) drug therapy: Secondary | ICD-10-CM

## 2023-05-11 NOTE — Patient Instructions (Signed)
Instructions for [facility] Medications  One or more of your medications was sent to the [facility] mail order compounding pharmacy. You will receive an email from them and can purchase the medicine through that link. It will then be mailed to your home at the address you confirmed. If for any reason you do not receive an email from them, please check your spam folder. If you still do not find the email, please let us know. [facility] phone number is [phone].    Cryotherapy Aftercare  Wash gently with soap and water everyday.   Apply Vaseline and Band-Aid daily until healed.    Due to recent changes in healthcare laws, you may see results of your pathology and/or laboratory studies on MyChart before the doctors have had a chance to review them. We understand that in some cases there may be results that are confusing or concerning to you. Please understand that not all results are received at the same time and often the doctors may need to interpret multiple results in order to provide you with the best plan of care or course of treatment. Therefore, we ask that you please give [place] 2 business days to thoroughly review all your results before contacting the office for clarification. Should we see a critical lab result, you will be contacted sooner.   If You Need Anything After Your Visit  If you have any questions or concerns for your doctor, please call our main line at [phone] and press option 4 to reach your doctor's medical assistant. If no one answers, please leave a voicemail as directed and we will return your call as soon as possible. Messages left after 4 pm will be answered the following business day.   You may also send [place] a message via MyChart. We typically respond to MyChart messages within 1-2 business days.  For prescription refills, please ask your pharmacy to contact our office. Our fax number is [phone].  If you have an urgent issue when the clinic  is closed that cannot wait until the next business day, you can page your doctor at the number below.    Please note that while we do our best to be available for urgent issues outside of office hours, we are not available 24/7.   If you have an urgent issue and are unable to reach [place], you may choose to seek medical care at your doctor's office, retail clinic, urgent care center, or emergency room.  If you have a medical emergency, please immediately call 911 or go to the emergency department.  Pager Numbers  - Dr. Gwen Pounds: [phone]  - Dr. Roseanne Reno: [phone]  In the event of inclement weather, please call our main line at [phone] for an update on the status of any delays or closures.  Dermatology Medication Tips: Please keep the boxes that topical medications come in in order to help keep track of the instructions about where and how to use these. Pharmacies typically print the medication instructions only on the boxes and not directly on the medication tubes.   If your medication is too expensive, please contact our office at [phone] option 4 or send [place] a message through MyChart.   We are unable to tell what your co-pay for medications will be in advance as this is different depending on your insurance coverage. However, we may be able to find a substitute medication at lower cost or fill out paperwork to get insurance to cover a needed medication.  If a prior authorization is required to get your medication covered by your insurance company, please allow [place] 1-2 business days to complete this process.  Drug prices often vary depending on where the prescription is filled and some pharmacies may offer cheaper prices.  The website www.goodrx.com contains coupons for medications through different pharmacies. The prices here do not account for what the cost may be with help from insurance (it may be cheaper with your insurance), but the website can give you the price if you  did not use any insurance.  - You can print the associated coupon and take it with your prescription to the pharmacy.  - You may also stop by our office during regular business hours and pick up a GoodRx coupon card.  - If you need your prescription sent electronically to a different pharmacy, notify our office through [facility] or by phone at [phone] option 4.     Si Usted Necesita Algo Despus de Su Visita  Tambin puede enviarnos un mensaje a travs de Clinical cytogeneticist. Por lo general respondemos a los mensajes de MyChart en el transcurso de 1 a 2 das hbiles.  Para renovar recetas, por favor pida a su farmacia que se ponga en contacto con nuestra oficina. Annie Sable de fax es [place] [phone].  Si tiene un asunto urgente cuando la clnica est cerrada y que no puede esperar hasta el siguiente da hbil, puede llamar/localizar a su doctor(a) al nmero que aparece a continuacin.   Por favor, tenga en cuenta que aunque hacemos todo lo posible para estar disponibles para asuntos urgentes fuera del horario de [place], no estamos disponibles las 24 horas del da, los 7 [address]  [address] de la [place].   Si tiene un problema urgente y no puede comunicarse con nosotros, puede optar por buscar atencin mdica  en el consultorio de su doctor(a), en una clnica privada, en un centro de atencin urgente o en una sala de emergencias.  Si tiene Engineer, drilling, por favor llame inmediatamente al 911 o vaya a la sala de emergencias.  Nmeros de bper  - Dr. Gwen Pounds: [phone]  - Dra. Roseanne Reno: [phone]  En caso de inclemencias del [place], por favor llame a Lacy Duverney principal al [phone] para una actualizacin sobre el [place] de cualquier retraso o cierre.  Consejos para la medicacin en dermatologa: Por favor, guarde las cajas en las que vienen los medicamentos de uso tpico para ayudarle a seguir las instrucciones sobre dnde y cmo usarlos. Las farmacias generalmente imprimen  las instrucciones del medicamento slo en las cajas y no directamente en los tubos del [place].   Si su medicamento es muy caro, por favor, pngase en contacto con Rolm Gala llamando al [phone] y presione la opcin 4 o envenos un mensaje a travs de Clinical cytogeneticist.   No podemos decirle cul ser su copago por los medicamentos por adelantado ya que esto es diferente dependiendo de la cobertura de su seguro. Sin embargo, es posible que podamos encontrar un medicamento sustituto a Audiological scientist un formulario para que el seguro cubra el medicamento que se considera necesario.   Si se requiere una autorizacin previa para que su compaa de seguros [place] su medicamento, por favor permtanos de 1 a 2 das hbiles para completar [address]  Los precios de los medicamentos varan con frecuencia dependiendo del Environmental consultant de dnde se surte la receta y alguna farmacias pueden ofrecer precios ms baratos.  El sitio web www.goodrx.com tiene cupones para medicamentos de  diferentes farmacias. Los precios aqu no tienen en cuenta lo que podra costar con la ayuda del seguro (puede ser ms barato con su seguro), pero el sitio web puede darle el precio si no utiliz Tourist information centre manager.  - Puede imprimir el cupn correspondiente y llevarlo con su receta a la farmacia.  - Tambin puede pasar por nuestra oficina durante el horario de atencin regular y Education officer, museum una tarjeta de cupones de GoodRx.  - Si necesita que su receta se enve electrnicamente a una farmacia diferente, informe a nuestra oficina a travs de MyChart de  o por telfono llamando al [phone] y presione la opcin 4.

## 2023-05-11 NOTE — Progress Notes (Signed)
Follow-Up Visit   Subjective  Donna Reid is a 57 y.o. female who presents for the following: AK follow up of nose treated with LN2  Spot of right lower leg that she would like checked  The patient has spots, moles and lesions to be evaluated, some may be new or changing and the patient may have concern these could be cancer.  The following portions of the chart were reviewed this encounter and updated as appropriate: medications, allergies, medical history  Review of Systems:  No other skin or systemic complaints except as noted in HPI or Assessment and Plan.  Objective  Well appearing patient in no apparent distress; mood and affect are within normal limits. A focused examination was performed of the following areas: Face Relevant exam findings are noted in the Assessment and Plan.  Mid Supratip of Nose Erythematous thin papules/macules with gritty scale.        Assessment & Plan   AK (actinic keratosis) Mid Supratip of Nose  ACTINIC DAMAGE WITH PRECANCEROUS ACTINIC KERATOSES Counseling for Topical Chemotherapy Management: Patient exhibits: - Severe, confluent actinic changes with pre-cancerous actinic keratoses that is secondary to cumulative UV radiation exposure over time - Condition that is severe; chronic, not at goal. - diffuse scaly erythematous macules and papules with underlying dyspigmentation - Discussed Prescription "Field Treatment" topical Chemotherapy for Severe, Chronic Confluent Actinic Changes with Pre-Cancerous Actinic Keratoses Field treatment involves treatment of an entire area of skin that has confluent Actinic Changes (Sun/ Ultraviolet light damage) and PreCancerous Actinic Keratoses by method of PhotoDynamic Therapy (PDT) and/or prescription Topical Chemotherapy agents such as 5-fluorouracil, 5-fluorouracil/calcipotriene, and/or imiquimod.  The purpose is to decrease the number of clinically evident and subclinical PreCancerous lesions to prevent  progression to development of skin cancer by chemically destroying early precancer changes that may or may not be visible.  It has been shown to reduce the risk of developing skin cancer in the treated area. As a result of treatment, redness, scaling, crusting, and open sores may occur during treatment course. One or more than one of these methods may be used and may have to be used several times to control, suppress and eliminate the PreCancerous changes. Discussed treatment course, expected reaction, and possible side effects. - Recommend daily broad spectrum sunscreen SPF 30+ to sun-exposed areas, reapply every 2 hours as needed.  - Staying in the shade or wearing long sleeves, sun glasses (UVA+UVB protection) and wide brim hats (4-inch brim around the entire circumference of the hat) are also recommended. - Call for new or changing lesions.  Apply 5-fluorouracil/calcipotriene cream twice a day for 7 days to affected areas including nose - patient to start in 6 weeks. Prescription sent to [facility]. Patient advised they will receive an email to purchase the medication online and have it sent to their home. Patient provided with handout reviewing treatment course and side effects and advised to call or message [place] on MyChart with any concerns.   May plan biopsy in the future if persistent.  Destruction of lesion - Mid Supratip of Nose Complexity: simple   Destruction method: cryotherapy   Informed consent: discussed and consent obtained   Timeout:  patient name, date of birth, surgical site, and procedure verified Lesion destroyed using liquid nitrogen: Yes   Region frozen until ice ball extended beyond lesion: Yes   Outcome: patient tolerated procedure well with no complications   Post-procedure details: wound care instructions given     Return in  about 4 months (around 09/10/2023) for AK follow up.  I, Joanie Coddington, CMA, am acting as scribe for Armida Sans, MD  .  Documentation: I have reviewed the above documentation for accuracy and completeness, and I agree with the above.  Armida Sans, MD

## 2023-05-20 ENCOUNTER — Encounter: Payer: Self-pay | Admitting: Dermatology

## 2023-09-15 ENCOUNTER — Ambulatory Visit: Payer: BC Managed Care – PPO | Admitting: Dermatology

## 2023-10-07 ENCOUNTER — Encounter: Payer: Self-pay | Admitting: Internal Medicine

## 2023-10-07 DIAGNOSIS — G4719 Other hypersomnia: Secondary | ICD-10-CM

## 2023-10-18 NOTE — Procedures (Signed)
 [facility]  Polysomnogram Report Part I                                                               Phone: [phone] Fax: [phone]  Patient Name: Donna Reid, Donna Reid Acquisition Number: [id number]  Date of Birth: [birth year] Acquisition Date: 10/07/2023  Referring Physician: Velma RAMAN. Deen MD     History: The patient is a 58 year old  who was referred for evaluation of . Medical History: Basal cell carcinoma, carpal tunnel syndrome, H/O cardiac radiofequency ablation, hemorrhagic shock, migraine, paroxysmal atrial fibrillation.  Medications: cyclobenzaprine, flecainde, metoprolol tartrate, venlafaxine, sumatriptan .  Procedure: This routine overnight polysomnogram was performed on the [facility] 5 using the standard diagnostic protocol. This included 6 channels of EEG, 2 channels of EOG, chin EMG, bilateral anterior tibialis EMG, nasal/oral thermistor, PTAF (nasal pressure transducer), chest and abdominal wall movements, EKG, and pulse oximetry.  Description: The total recording time was 406.0 minutes. The total sleep time was 368.5 minutes. There were a total of 20.5 minutes of wakefulness after sleep onset for a slightly reducedsleep efficiency of 90.8%. The latency to sleep onset waswithin normal limitsat 17.0 minutes. The R sleep onset latency wasprolonged at 191.5 minutes. Sleep parameters, as a percentage of the total sleep time, demonstrated 8.8% of sleep was in N1 sleep, 76.5% N2, 0.1% N3 and 14.5% R sleep. There were a total of 13 arousals for an arousal index of 2.1 arousals per hour of sleep that was normal.  Respiratory monitoring demonstrated   snoring . There were 17 apneas and hypopneas for an Apnea Hypopnea Index of 2.8 apneas and hypopneas per hour of sleep. The REM related apnea hypopnea index was 3.4/hr of REM sleep compared to a NREM AHI of 2.7/hr.  The average duration of the respiratory events was 14.4 seconds with a maximum duration of 20.0 seconds. The respiratory events  occurred mostly in the supine position with an AHI of 6.1. The respiratory events were associated with peripheral oxygen desaturations on the average to 93%. The lowest oxygen desaturation associated with a respiratory event was 87%. Additionally, the baseline oxygen saturation during wakefulness was 97%, during NREM sleep averaged 96%, and during REM sleep averaged  97%. The total duration of oxygen < 90% was 0.0 minutes.  Cardiac monitoring-  demonstrate transient cardiac decelerations associated with the apneas.  significant cardiac rhythm irregularities.   Periodic limb movement monitoring- demonstrated that there were 5 periodic limb movements for a periodic limb movement index of 0.8 periodic limb movements per hour of sleep.     Impression: This routine overnight polysomnogram did not demonstrate significant obstructive sleep apnea due to a low Apnea Hypopnea Index of 2.8 apneas and hypopneas per hour with the lowest desaturation to 87%.  Most of the respiratory events occurred in the supine position.  Sleep efficiency was only slightly reduced. REM and slow wave percentages were reduced.  Recommendations:     CPAP is not indicated based on current guidelines Would recommend avoiding the supine sleep position to help alleviate upper airway obstruction.    Donna RONAL Bathe, MD, [facility] Diplomate ABMS-Pulmonary, Critical Care and Sleep Medicine  Electronically reviewed and digitally signed  [facility] Polysomnogram Report Part II  Phone: [phone] Fax: [phone]  Patient last name [name] Neck Size  14  in. Acquisition [phone]  Patient first name Donna Reid Weight 140.0 lbs. Started 10/07/2023 at 11:02:28 PM  Birth date 1966/04/27 Height 66.0 in. Stopped 10/08/2023 at 5:59:58 AM  Age 43 BMI 22.6 lb/in2 Duration 406.0  Study Type Adult      Donna Reid. RPSGT. / Donna Reid   Reviewed by: Donna MATSU. Henke, PhD, ABSM, FAASM Sleep Data: Lights Out: 11:10:58 PM Sleep  Onset: 11:27:58 PM  Lights On: 5:56:58 AM Sleep Efficiency: 90.8 %  Total Recording Time: 406.0 min Sleep Latency (from Lights Off) 17.0 min  Total Sleep Time (TST): 368.5 min R Latency (from Sleep Onset): 191.5 min  Sleep Period Time: 389.0 min Total number of awakenings: 15  Wake during sleep: 20.5 min Wake After Sleep Onset (WASO): 20.5 min   Sleep Data:         Arousal Summary: Stage  Latency from lights out (min) Latency from sleep onset (min) Duration (min) % Total Sleep Time  Normal values  N 1 17.0 0.0 32.5 8.8 (5%)  N 2 18.5 1.5 282.0 76.5 (50%)  N 3 100.0 83.0 0.5 0.1 (20%)  R 208.5 191.5 53.5 14.5 (25%)   Number Index  Spontaneous 13 2.1  Apneas & Hypopneas 0 0.0  RERAs 0 0.0       (Apneas & Hypopneas & RERAs)  (0) (0.0)  Limb Movement 0 0.0  Snore 0 0.0  TOTAL 13 2.1     Respiratory Data:  CA OA MA Apnea Hypopnea* A+ H RERA Total  Number 0 1 0 1 16 17  0 17  Mean Dur (sec) 0.0 17.5 0.0 17.5 14.3 14.4 0.0 14.4  Max Dur (sec) 0.0 17.5 0.0 17.5 20.0 20.0 0.0 20.0  Total Dur (min) 0.0 0.3 0.0 0.3 3.8 4.1 0.0 4.1  % of TST 0.0 0.1 0.0 0.1 1.0 1.1 0.0 1.1  Index (#/h TST) 0.0 0.2 0.0 0.2 2.6 2.8 0.0 2.8  *Hypopneas scored based on 4% or greater desaturation.  Sleep Stage:        REM NREM TST  AHI 3.4 2.7 2.8  RDI 3.4 2.7 2.8           Body Position Data:  Sleep (min) TST (%) REM (min) NREM (min) CA (#) OA (#) MA (#) HYP (#) AHI (#/h) RERA (#) RDI (#/h) Desat (#)  Supine 156.8 42.55 28.5 128.3 0 0 0 16 6.1 0 6.1 17  Non-Supine 211.70 57.45 25.00 186.70 0.00 1.00 0.00 0.00 0.28 0 0.28 0.00  Left: 122.3 33.19 3.5 118.8 0 0 0 0 0.0 0 0.00 0  Right: 89.4 24.26 21.5 67.9 0 1 0 0 0.7 0 0.7 0     Snoring: Total number of snoring episodes  0  Total time with snoring    min (   % of sleep)   Oximetry Distribution:             WK REM NREM TOTAL  Average (%)   97 97 96 96  < 90% 0.0 0.0 0.0 0.0  < 80% 0.0 0.0 0.0 0.0  < 70% 0.0 0.0 0.0 0.0  # of  Desaturations* 0 4 13 17   Desat Index (#/hour) 0.0 4.5 2.5 2.8  Desat Max (%) 0 6 11 11   Desat Max Dur (sec) 0.0 56.0 114.0 114.0  Approx Min O2 during sleep 87  Approx min O2 during a respiratory event 87  Was Oxygen added (Y/N) and final rate :  LPM  *Desaturations based on 4% or greater drop from baseline.   Cheyne Stokes Breathing: None Present    Heart Rate Summary:  Average Heart Rate During Sleep 52.8 bpm      Highest Heart Rate During Sleep (95th %) 57.0 bpm      Highest Heart Rate During Sleep 111 bpm      Highest Heart Rate During Recording (TIB) 239 bpm (artifact)   Heart Rate Observations: Event Type # Events   Bradycardia 0 Lowest HR Scored: N/A  Sinus Tachycardia During Sleep 0 Highest HR Scored: N/A  Narrow Complex Tachycardia 0 Highest HR Scored: N/A  Wide Complex Tachycardia 0 Highest HR Scored: N/A  Asystole 0 Longest Pause: N/A  Atrial Fibrillation 0 Duration Longest Event: N/A  Other Arrythmias   Type:    Periodic Limb Movement Data: (Primary legs unless otherwise noted) Total # Limb Movement 5 Limb Movement Index 0.8  Total # PLMS 5 PLMS Index 0.8  Total # PLMS Arousals    PLMS Arousal Index     Percentage Sleep Time with PLMS 2.44min (0.8 % sleep)  Mean Duration limb movements (secs) 172.5

## 2023-12-06 ENCOUNTER — Other Ambulatory Visit: Payer: Self-pay | Admitting: Certified Nurse Midwife

## 2023-12-06 DIAGNOSIS — Z1231 Encounter for screening mammogram for malignant neoplasm of breast: Secondary | ICD-10-CM

## 2023-12-12 ENCOUNTER — Ambulatory Visit
Admission: RE | Admit: 2023-12-12 | Discharge: 2023-12-12 | Disposition: A | Discharge status: Home / Self Care | Payer: Self-pay | Source: Ambulatory Visit | Attending: Certified Nurse Midwife | Admitting: Certified Nurse Midwife

## 2023-12-12 DIAGNOSIS — Z1231 Encounter for screening mammogram for malignant neoplasm of breast: Secondary | ICD-10-CM | POA: Insufficient documentation
